# Patient Record
Sex: Female | Born: 1935 | Hispanic: No
Health system: Southern US, Community
[De-identification: ages and names within clinical notes are randomized; demographics above are authoritative.]

## PROBLEM LIST (undated history)

## (undated) ENCOUNTER — Emergency Department (HOSPITAL_COMMUNITY): Admission: EM | Payer: Self-pay | Source: Home / Self Care

## (undated) DIAGNOSIS — I10 Essential (primary) hypertension: Secondary | ICD-10-CM

## (undated) HISTORY — PX: OTHER SURGICAL HISTORY: SHX169

## (undated) HISTORY — PX: NO PAST SURGERIES: SHX2092

---

## 2007-11-19 ENCOUNTER — Emergency Department (HOSPITAL_BASED_OUTPATIENT_CLINIC_OR_DEPARTMENT_OTHER): Admission: EM | Admit: 2007-11-19 | Discharge: 2007-11-19 | Payer: Self-pay | Admitting: Emergency Medicine

## 2010-10-05 LAB — POCT CARDIAC MARKERS
CKMB, poc: <1 — ABNORMAL LOW
CKMB, poc: <1 — ABNORMAL LOW
Myoglobin, poc: 157
Myoglobin, poc: 74
Troponin i, poc: <0.05
Troponin i, poc: <0.05

## 2010-10-05 LAB — CBC
HCT: 39.1
Hemoglobin: 13.2
MCHC: 33.7
MCV: 87
Platelets: 207
RBC: 4.49
RDW: 13
WBC: 9.6

## 2010-10-05 LAB — COMPREHENSIVE METABOLIC PANEL WITH GFR
ALT: <6
Alkaline Phosphatase: 74
CO2: 28
GFR calc non Af Amer: >60
Glucose, Bld: 145 — ABNORMAL HIGH
Potassium: 3.5
Sodium: 133 — ABNORMAL LOW
Total Bilirubin: 1.3 — ABNORMAL HIGH

## 2010-10-05 LAB — COMPREHENSIVE METABOLIC PANEL
AST: 26
Albumin: 3.9
BUN: 7
Calcium: 9.1
Chloride: 97
Creatinine, Ser: 0.7
GFR calc Af Amer: >60
Total Protein: 7.4

## 2010-10-05 LAB — DIFFERENTIAL
Basophils Absolute: 0
Basophils Relative: 0
Eosinophils Absolute: 0
Eosinophils Relative: 0
Lymphocytes Relative: 8 — ABNORMAL LOW
Lymphs Abs: 0.7
Monocytes Absolute: 0.8
Monocytes Relative: 8
Neutro Abs: 8.1 — ABNORMAL HIGH
Neutrophils Relative %: 83 — ABNORMAL HIGH

## 2010-10-05 LAB — LIPASE, BLOOD: Lipase: 96

## 2012-08-13 ENCOUNTER — Ambulatory Visit: Payer: Self-pay

## 2012-08-20 ENCOUNTER — Ambulatory Visit: Payer: Self-pay

## 2015-08-06 DIAGNOSIS — R630 Anorexia: Secondary | ICD-10-CM | POA: Diagnosis not present

## 2015-08-06 DIAGNOSIS — I10 Essential (primary) hypertension: Secondary | ICD-10-CM | POA: Diagnosis not present

## 2015-08-06 DIAGNOSIS — H9041 Sensorineural hearing loss, unilateral, right ear, with unrestricted hearing on the contralateral side: Secondary | ICD-10-CM | POA: Diagnosis not present

## 2015-08-30 DIAGNOSIS — R5383 Other fatigue: Secondary | ICD-10-CM | POA: Diagnosis not present

## 2015-08-30 DIAGNOSIS — I10 Essential (primary) hypertension: Secondary | ICD-10-CM | POA: Diagnosis not present

## 2015-11-22 DIAGNOSIS — Z23 Encounter for immunization: Secondary | ICD-10-CM | POA: Diagnosis not present

## 2015-12-03 DIAGNOSIS — M79673 Pain in unspecified foot: Secondary | ICD-10-CM | POA: Diagnosis not present

## 2015-12-03 DIAGNOSIS — E2839 Other primary ovarian failure: Secondary | ICD-10-CM | POA: Diagnosis not present

## 2015-12-03 DIAGNOSIS — Z1231 Encounter for screening mammogram for malignant neoplasm of breast: Secondary | ICD-10-CM | POA: Diagnosis not present

## 2015-12-03 DIAGNOSIS — K7689 Other specified diseases of liver: Secondary | ICD-10-CM | POA: Diagnosis not present

## 2015-12-03 DIAGNOSIS — R630 Anorexia: Secondary | ICD-10-CM | POA: Diagnosis not present

## 2015-12-03 DIAGNOSIS — Z23 Encounter for immunization: Secondary | ICD-10-CM | POA: Diagnosis not present

## 2015-12-03 DIAGNOSIS — Z0000 Encounter for general adult medical examination without abnormal findings: Secondary | ICD-10-CM | POA: Diagnosis not present

## 2015-12-03 DIAGNOSIS — H903 Sensorineural hearing loss, bilateral: Secondary | ICD-10-CM | POA: Diagnosis not present

## 2015-12-03 DIAGNOSIS — I10 Essential (primary) hypertension: Secondary | ICD-10-CM | POA: Diagnosis not present

## 2015-12-03 DIAGNOSIS — E785 Hyperlipidemia, unspecified: Secondary | ICD-10-CM | POA: Diagnosis not present

## 2015-12-03 DIAGNOSIS — Z1211 Encounter for screening for malignant neoplasm of colon: Secondary | ICD-10-CM | POA: Diagnosis not present

## 2015-12-13 DIAGNOSIS — H903 Sensorineural hearing loss, bilateral: Secondary | ICD-10-CM | POA: Diagnosis not present

## 2015-12-14 DIAGNOSIS — M79672 Pain in left foot: Secondary | ICD-10-CM | POA: Diagnosis not present

## 2015-12-14 DIAGNOSIS — G609 Hereditary and idiopathic neuropathy, unspecified: Secondary | ICD-10-CM | POA: Diagnosis not present

## 2015-12-14 DIAGNOSIS — M79671 Pain in right foot: Secondary | ICD-10-CM | POA: Diagnosis not present

## 2016-03-03 DIAGNOSIS — E2839 Other primary ovarian failure: Secondary | ICD-10-CM | POA: Diagnosis not present

## 2016-03-03 DIAGNOSIS — Z6821 Body mass index (BMI) 21.0-21.9, adult: Secondary | ICD-10-CM | POA: Diagnosis not present

## 2016-03-03 DIAGNOSIS — I10 Essential (primary) hypertension: Secondary | ICD-10-CM | POA: Diagnosis not present

## 2016-03-03 DIAGNOSIS — H538 Other visual disturbances: Secondary | ICD-10-CM | POA: Diagnosis not present

## 2016-03-22 ENCOUNTER — Emergency Department (HOSPITAL_COMMUNITY): Payer: Medicare Other

## 2016-03-22 ENCOUNTER — Encounter (HOSPITAL_COMMUNITY): Payer: Self-pay | Admitting: Radiology

## 2016-03-22 ENCOUNTER — Emergency Department (HOSPITAL_COMMUNITY)
Admission: EM | Admit: 2016-03-22 | Discharge: 2016-03-22 | Disposition: A | Discharge status: Home / Self Care | Payer: Medicare Other | Attending: Emergency Medicine | Admitting: Emergency Medicine

## 2016-03-22 DIAGNOSIS — M79673 Pain in unspecified foot: Secondary | ICD-10-CM

## 2016-03-22 DIAGNOSIS — R0609 Other forms of dyspnea: Secondary | ICD-10-CM | POA: Diagnosis not present

## 2016-03-22 DIAGNOSIS — M79671 Pain in right foot: Secondary | ICD-10-CM | POA: Insufficient documentation

## 2016-03-22 DIAGNOSIS — R230 Cyanosis: Secondary | ICD-10-CM | POA: Diagnosis not present

## 2016-03-22 DIAGNOSIS — M79672 Pain in left foot: Secondary | ICD-10-CM | POA: Diagnosis not present

## 2016-03-22 DIAGNOSIS — I739 Peripheral vascular disease, unspecified: Secondary | ICD-10-CM | POA: Diagnosis not present

## 2016-03-22 DIAGNOSIS — I10 Essential (primary) hypertension: Secondary | ICD-10-CM | POA: Insufficient documentation

## 2016-03-22 DIAGNOSIS — M79674 Pain in right toe(s): Secondary | ICD-10-CM | POA: Diagnosis not present

## 2016-03-22 DIAGNOSIS — M79675 Pain in left toe(s): Secondary | ICD-10-CM | POA: Diagnosis not present

## 2016-03-22 HISTORY — DX: Essential (primary) hypertension: I10

## 2016-03-22 LAB — BASIC METABOLIC PANEL
Anion gap: 8 (ref 5–15)
BUN: 13 mg/dL (ref 6–20)
CALCIUM: 9.3 mg/dL (ref 8.9–10.3)
CO2: 27 mmol/L (ref 22–32)
CREATININE: 0.9 mg/dL (ref 0.44–1.00)
Chloride: 100 mmol/L — ABNORMAL LOW (ref 101–111)
GFR calc non Af Amer: 59 mL/min — ABNORMAL LOW (ref >60)
Glucose, Bld: 91 mg/dL (ref 65–99)
Potassium: 4.4 mmol/L (ref 3.5–5.1)
SODIUM: 135 mmol/L (ref 135–145)

## 2016-03-22 LAB — CBC
HCT: 41.3 % (ref 36.0–46.0)
Hemoglobin: 13.5 g/dL (ref 12.0–15.0)
MCH: 28.1 pg (ref 26.0–34.0)
MCHC: 32.7 g/dL (ref 30.0–36.0)
MCV: 85.9 fL (ref 78.0–100.0)
Platelets: 209 10*3/uL (ref 150–400)
RBC: 4.81 MIL/uL (ref 3.87–5.11)
RDW: 13.1 % (ref 11.5–15.5)
WBC: 7.3 10*3/uL (ref 4.0–10.5)

## 2016-03-22 MED ORDER — IOPAMIDOL (ISOVUE-370) INJECTION 76%
INTRAVENOUS | Status: AC
  Administered 2016-03-22: 100 mL
  Filled 2016-03-22: qty 100

## 2016-03-22 NOTE — Discharge Instructions (Signed)
Follow up with your doctor as instructed by Dr Darrick PennaFields

## 2016-03-22 NOTE — Consult Note (Addendum)
Patient name: Rebecca FishmanSudhaben Minor MRN: [id number] DOB: 05/17/1934 Sex: female  REASON FOR CONSULT: Blue toes  HPI: Rebecca FishmanSudhaben Barnette is a 82 y.o. female with about 10 day history of bilateral dusky toes initially which are now ruborous with red splotches on the dorsum of the feet.  She was sent by her primary care physician to the ER.  She has some chronic numbness and tingling in the feet that has been present for about 6 months.  She denies claudication.  She has some pain in the toes.  She denies family history of aneurysms.  She denies chest pain shortness of breath or abdominal pain.  She has no non-healing wounds. Other medical problems include hypertension for which she takes medication.  She has never smoked.  PMH and PSH as above  Family HX as above SOCIAL HISTORY: Social History   Social History  . Marital status: Widowed    Spouse name: N/A  . Number of children: N/A  . Years of education: N/A   Occupational History  . Not on file.   Social History Main Topics  . Smoking status: Not on file  . Smokeless tobacco: Not on file  . Alcohol use Not on file  . Drug use: Unknown  . Sexual activity: Not on file   Other Topics Concern  . Not on file   Social History Narrative  . No narrative on file    No Known Allergies  Meds: Pt takes metoprolol for hypertension  ROS:   General:  No weight loss, Fever, chills  HEENT: No recent headaches, no nasal bleeding, no visual changes, no sore throat  Neurologic: No dizziness, blackouts, seizures. No recent symptoms of stroke or mini- stroke. No recent episodes of slurred speech, or temporary blindness.  Cardiac: No recent episodes of chest pain/pressure, no shortness of breath at rest.  No shortness of breath with exertion.  Denies history of atrial fibrillation or irregular heartbeat  Vascular: No history of rest pain in feet.  No history of claudication.  No history of non-healing ulcer, No history of DVT   Pulmonary: No  home oxygen, no productive cough, no hemoptysis,  No asthma or wheezing  Musculoskeletal:  [ ]  Arthritis, [ ]  Low back pain,  [ ]  Joint pain  Hematologic:No history of hypercoagulable state.  No history of easy bleeding.  No history of anemia  Gastrointestinal: No hematochezia or melena,  No gastroesophageal reflux, no trouble swallowing  Urinary: [ ]  chronic Kidney disease, [ ]  on HD - [ ]  MWF or [ ]  TTHS, [ ]  Burning with urination, [ ]  Frequent urination, [ ]  Difficulty urinating;   Skin: No rashes other than feet as mentioned above  Psychological: No history of anxiety,  No history of depression   Physical Examination  Vitals:   03/22/16 1730 03/22/16 1731 03/22/16 1745 03/22/16 1800  BP: (!) 213/76 (!) 213/76 (!) 193/86 (!) 185/76  Pulse: (!) 55 (!) 55 (!) 55 (!) 56  Resp:  20    Temp:      SpO2: 100% 100% 100% 100%    There is no height or weight on file to calculate BMI.  General:  Alert and oriented, no acute distress HEENT: Normal Neck: No bruit or JVD Pulmonary: Clear to auscultation bilaterally Cardiac: Regular Rate and Rhythm  Abdomen: Soft, non-tender, non-distended, no mass, no scars Skin: No rash, splotchy red spots 2-3 cm diameter non blanching dorsal feet bilaterally about 5 of these each foot.  Toes  ruborous with brisk cap refill Extremity Pulses:  2+ radial, brachial, femoral, dorsalis pedis, posterior tibial pulses bilaterally Musculoskeletal: No deformity or edema  Neurologic: Upper and lower extremity motor 5/5 and symmetric   ASSESSMENT:  Pt with pain in toes, duskiness now resolved from a few days ago now with red splotches.  Has palpable pedal pulses so not currently limb threatening.  This could represent atheroembolic event otherwise some inflammatory or dermatologic process.  Numbness more likely neuropathy   PLAN:  Will obtain ct angio chest abdomen pelvis with as much runoff as possible to rule out central embolic source  Plan discussed with  ER physicians who will consider further work up or other etiology if no embolic source seen on CT   Fabienne Bruns, MD [facility] Office: [phone] Pager: [phone]  CT shows no source of embolus entire aorto iliac and proximal vessels all patent.  No vascular etiology for her pain or rash.  CT also shows possible mass in right lobe of liver and recommended MRI.  I discussed with pt and daughters and told them this could be followed with Dr Chales Salmon with [facility] to d/c home from my standpoint  Fabienne Bruns, MD [facility] Office: [phone] Pager: [phone]

## 2016-03-22 NOTE — ED Notes (Signed)
Pt has hx of HTN and states that she took her medication this morning at 10.

## 2016-03-22 NOTE — ED Triage Notes (Signed)
Pt here from MD office. PT has had 10 days of bilateral foot discoloration with coldness to touch and decreased sensation. Per family- MD Fields to come see the patient. Discoloration noted to bilateral feet.

## 2016-03-22 NOTE — ED Notes (Signed)
Pt returned from CT °

## 2016-03-22 NOTE — ED Notes (Signed)
Patient transported to CT 

## 2016-03-27 DIAGNOSIS — I739 Peripheral vascular disease, unspecified: Secondary | ICD-10-CM | POA: Diagnosis not present

## 2016-03-27 DIAGNOSIS — G629 Polyneuropathy, unspecified: Secondary | ICD-10-CM | POA: Diagnosis not present

## 2016-03-27 DIAGNOSIS — K7689 Other specified diseases of liver: Secondary | ICD-10-CM | POA: Diagnosis not present

## 2016-03-27 DIAGNOSIS — E038 Other specified hypothyroidism: Secondary | ICD-10-CM | POA: Diagnosis not present

## 2016-03-27 DIAGNOSIS — I10 Essential (primary) hypertension: Secondary | ICD-10-CM | POA: Diagnosis not present

## 2016-03-27 DIAGNOSIS — R630 Anorexia: Secondary | ICD-10-CM | POA: Diagnosis not present

## 2016-03-27 DIAGNOSIS — I776 Arteritis, unspecified: Secondary | ICD-10-CM | POA: Diagnosis not present

## 2016-03-27 DIAGNOSIS — G603 Idiopathic progressive neuropathy: Secondary | ICD-10-CM | POA: Diagnosis not present

## 2016-05-22 DIAGNOSIS — R05 Cough: Secondary | ICD-10-CM | POA: Diagnosis not present

## 2016-05-22 DIAGNOSIS — J019 Acute sinusitis, unspecified: Secondary | ICD-10-CM | POA: Diagnosis not present

## 2016-05-22 DIAGNOSIS — I10 Essential (primary) hypertension: Secondary | ICD-10-CM | POA: Diagnosis not present

## 2016-05-22 DIAGNOSIS — J209 Acute bronchitis, unspecified: Secondary | ICD-10-CM | POA: Diagnosis not present

## 2016-06-06 DIAGNOSIS — I776 Arteritis, unspecified: Secondary | ICD-10-CM | POA: Diagnosis not present

## 2016-06-06 DIAGNOSIS — K2900 Acute gastritis without bleeding: Secondary | ICD-10-CM | POA: Diagnosis not present

## 2016-06-06 DIAGNOSIS — R630 Anorexia: Secondary | ICD-10-CM | POA: Diagnosis not present

## 2016-06-06 DIAGNOSIS — I10 Essential (primary) hypertension: Secondary | ICD-10-CM | POA: Diagnosis not present

## 2016-07-11 DIAGNOSIS — R630 Anorexia: Secondary | ICD-10-CM | POA: Diagnosis not present

## 2016-07-11 DIAGNOSIS — I10 Essential (primary) hypertension: Secondary | ICD-10-CM | POA: Diagnosis not present

## 2016-07-11 DIAGNOSIS — Z1211 Encounter for screening for malignant neoplasm of colon: Secondary | ICD-10-CM | POA: Diagnosis not present

## 2016-07-11 DIAGNOSIS — G609 Hereditary and idiopathic neuropathy, unspecified: Secondary | ICD-10-CM | POA: Diagnosis not present

## 2016-07-12 ENCOUNTER — Encounter: Payer: Self-pay | Admitting: Neurology

## 2016-07-21 DIAGNOSIS — Z1211 Encounter for screening for malignant neoplasm of colon: Secondary | ICD-10-CM | POA: Diagnosis not present

## 2016-09-08 ENCOUNTER — Ambulatory Visit (INDEPENDENT_AMBULATORY_CARE_PROVIDER_SITE_OTHER): Payer: Medicare Other | Admitting: Neurology

## 2016-09-08 ENCOUNTER — Encounter: Payer: Self-pay | Admitting: Neurology

## 2016-09-08 VITALS — BP 124/70 | HR 60 | Ht 61.0 in | Wt 100.5 lb

## 2016-09-08 DIAGNOSIS — R209 Unspecified disturbances of skin sensation: Secondary | ICD-10-CM | POA: Diagnosis not present

## 2016-09-08 DIAGNOSIS — I7300 Raynaud's syndrome without gangrene: Secondary | ICD-10-CM | POA: Diagnosis not present

## 2016-09-08 NOTE — Patient Instructions (Addendum)
It was lovely to see you today.  You have remarkably normal neurological exam and I do not see that your feet pain is coming from a nerve problem.  You may have a little less circulation in the legs and we can order an ultrasound of the legs, if your pain gets worse.  Continue your medications as you are taking and start to apply powder to your feet when wearing stocks.  Please come back and see me, if you have any other questions or concerns.

## 2016-09-08 NOTE — Progress Notes (Signed)
[facility] Clinic Note - Initial Visit   Date: 09/08/16  Rebecca Foster MRN: [id number] DOB: [birth year]   Dear Dr. Chales Salmon:  Thank you for your kind referral of Rebecca Foster for consultation of bilateral feet pain. Although her history is well known to you, please allow [place] to reiterate it for the purpose of our medical record. The patient was accompanied to the clinic by daughter who also provides collateral information.     History of Present Illness: Rebecca Foster is a delightful 82 y.o. [place] female with hypertension and GERD presenting for evaluation of bilateral feet pain.    Starting around the early summer of 2018, she began having cold sensation of the feet, which would keep her awake at  There was no tingling or numbness. One night, she developed severe pain of the feet and blue-colored toes, which prompted her to go to the ER.  She had CTA chest/abd/pelvis which showed patent vessels above the knees. Since starting amlodipine 2.5mg , she has noticed marked improvement of pain, but continues to have cold feet.  Soaking her feet in warm water helps.  She tried gabapentin, which was discontinued due to side effects of bleeding.  She denies leg weakness but can get tired when walking and needs to rest.  She does not have imbalance and walks unassisted.  She has been loosing weight lately and was taking megace.  Her vitamin B12 and TSH from March 2018 was within normal limits.   Out-side paper records, electronic medical record, and images have been reviewed where available and summarized as:  Labs 03/2016:  Vitamin B12 467, TSH 5.63  Lab Results  Component Value Date   CREATININE 0.90 03/22/2016   BUN 13 03/22/2016   NA 135 03/22/2016   K 4.4 03/22/2016   CL 100 (L) 03/22/2016   CO2 27 03/22/2016    Past Medical History:  Diagnosis Date  . Hypertension     History reviewed. No pertinent surgical history.   Medications:  Outpatient  Encounter Prescriptions as of 09/08/2016  Medication Sig  . amLODipine (NORVASC) 2.5 MG tablet   . megestrol (MEGACE) 20 MG tablet   . metoprolol succinate (TOPROL-XL) 100 MG 24 hr tablet Take 100 mg by mouth 2 (two) times daily. Take with or immediately following a meal.   No facility-administered encounter medications on file as of 09/08/2016.      Allergies:  Allergies  Allergen Reactions  . Amoxicillin   . Codeine Other (See Comments)    Higher dose - sensitive, burning sensation     Family History:  There is no family history of neuropathy.  Social History: Social History  Substance Use Topics  . Smoking status: Never Smoker  . Smokeless tobacco: Never Used  . Alcohol use No   Social History   Social History Narrative   Lives alone in a one story home.  Has 3 daughters.  Education: high school.    Review of Systems:  CONSTITUTIONAL: No fevers, chills, night sweats, +weight loss.   EYES: No visual changes or eye pain ENT: No hearing changes.  No history of nose bleeds.   RESPIRATORY: No cough, wheezing and shortness of breath.   CARDIOVASCULAR: Negative for chest pain, and palpitations.   GI: Negative for abdominal discomfort, blood in stools or black stools.  No recent change in bowel habits.   GU:  No history of incontinence.   MUSCLOSKELETAL: No history of joint pain or swelling.  No myalgias.   SKIN:  Negative for lesions, rash, and itching.  +cold feet HEMATOLOGY/ONCOLOGY: Negative for prolonged bleeding, bruising easily, and swollen nodes.  No history of cancer.   ENDOCRINE: Negative for cold or heat intolerance, polydipsia or goiter.   PSYCH: No depression or anxiety symptoms.   NEURO: As Above.   Vital Signs:  BP 124/70   Pulse 60   Ht  (1.549 m)   Wt 100 lb 8 oz (45.6 kg)   SpO2 95%   BMI 18.99 kg/m    General Medical Exam:   General:  Well appearing, thin, comfortable.   Eyes/ENT: see cranial nerve examination.   Neck: No masses appreciated.   Full range of motion without tenderness.  No carotid bruits. Respiratory:  Clear to auscultation, good air entry bilaterally.   Cardiac:  Regular rate and rhythm, no murmur.   Extremities:  No deformities, edema, or skin discoloration. Feet are cool to touch, but distal pulses are intact Skin:  No rashes or lesions.  Neurological Exam: MENTAL STATUS including orientation to time, place, person, recent and remote memory, attention span and concentration, language, and fund of knowledge is normal.  Speech is not dysarthric.  CRANIAL NERVES: II:  No visual field defects.   III-IV-VI: Pupils equal round and reactive to light.  Normal conjugate, extra-ocular eye movements in all directions of gaze.  No nystagmus.  No ptosis.   V:  Normal facial sensation.   VII:  Normal facial symmetry and movements.   VIII:  Normal hearing and vestibular function.   IX-X:  Normal palatal movement.   XI:  Normal shoulder shrug and head rotation.   XII:  Normal tongue strength and range of motion, no deviation or fasciculation.  MOTOR:  No atrophy, fasciculations or abnormal movements.  No pronator drift.  Tone is normal.    Right Upper Extremity:    Left Upper Extremity:    Deltoid  5/5   Deltoid  5/5   Biceps  5/5   Biceps  5/5   Triceps  5/5   Triceps  5/5   Wrist extensors  5/5   Wrist extensors  5/5   Wrist flexors  5/5   Wrist flexors  5/5   Finger extensors  5/5   Finger extensors  5/5   Finger flexors  5/5   Finger flexors  5/5   Dorsal interossei  5/5   Dorsal interossei  5/5   Abductor pollicis  5/5   Abductor pollicis  5/5   Tone (Ashworth scale)  0  Tone (Ashworth scale)  0   Right Lower Extremity:    Left Lower Extremity:    Hip flexors  5/5   Hip flexors  5/5   Hip extensors  5/5   Hip extensors  5/5   Knee flexors  5/5   Knee flexors  5/5   Knee extensors  5/5   Knee extensors  5/5   Dorsiflexors  5/5   Dorsiflexors  5/5   Plantarflexors  5/5   Plantarflexors  5/5   Toe extensors  5/5    Toe extensors  5/5   Toe flexors  5/5   Toe flexors  5/5   Tone (Ashworth scale)  0  Tone (Ashworth scale)  0   MSRs:  Right  Left brachioradialis 2+  brachioradialis 2+  biceps 2+  biceps 2+  triceps 2+  triceps 2+  patellar 2+  patellar 2+  ankle jerk 1+  ankle jerk 1+  Hoffman no  Hoffman no  plantar response down  plantar response down   SENSORY:  Normal and symmetric perception of light touch, pinprick, vibration, and proprioception.  Romberg's sign absent.   COORDINATION/GAIT: Normal finger-to- nose-finger.  Intact rapid alternating movements bilaterally. Gait narrow based and stable. Heel walk is limited due to ankle pain.  IMPRESSION: Rebecca Foster is a delightful 82 year-old female referred for bilateral feet pain and cold sensation.  Her pain have markedly improved with low dose amlodipine suggesting that symptoms may be due to Raynaud's phenomenon.  Her neurological exam is remarkably normal making neuropathy less likely.  Given low clinical suspicion for neuropathy and improved symptoms, I do not see that NCS/EMG is indicated at this time.  If her symptoms progress, I recommend arterial studies of the legs as the next step.  Her daughter will call the office with any new neurological concerns/questions.   I also encouraged her to take her megace as appetite stimulant as she is under weight (BMI 18.9).  She does not like the taste of ensure, so I urged her to liberally eat the foods that she likes.   The duration of this appointment visit was 40 minutes of face-to-face time with the patient.  Greater than 50% of this time was spent in counseling, explanation of diagnosis, planning of further management, and coordination of care.   Thank you for allowing me to participate in patient's care.  If I can answer any additional questions, I would be pleased to do so.    Sincerely,    Sommer Spickard K. Allena Katz, DO

## 2016-10-16 ENCOUNTER — Ambulatory Visit: Payer: Medicare Other | Admitting: Neurology

## 2016-10-31 DIAGNOSIS — E2839 Other primary ovarian failure: Secondary | ICD-10-CM | POA: Diagnosis not present

## 2016-10-31 DIAGNOSIS — I10 Essential (primary) hypertension: Secondary | ICD-10-CM | POA: Diagnosis not present

## 2016-10-31 DIAGNOSIS — Z23 Encounter for immunization: Secondary | ICD-10-CM | POA: Diagnosis not present

## 2016-10-31 DIAGNOSIS — I739 Peripheral vascular disease, unspecified: Secondary | ICD-10-CM | POA: Diagnosis not present

## 2016-12-06 DIAGNOSIS — Z7189 Other specified counseling: Secondary | ICD-10-CM | POA: Diagnosis not present

## 2016-12-06 DIAGNOSIS — Z1389 Encounter for screening for other disorder: Secondary | ICD-10-CM | POA: Diagnosis not present

## 2016-12-06 DIAGNOSIS — Z0000 Encounter for general adult medical examination without abnormal findings: Secondary | ICD-10-CM | POA: Diagnosis not present

## 2016-12-06 DIAGNOSIS — R946 Abnormal results of thyroid function studies: Secondary | ICD-10-CM | POA: Diagnosis not present

## 2016-12-06 DIAGNOSIS — I7300 Raynaud's syndrome without gangrene: Secondary | ICD-10-CM | POA: Diagnosis not present

## 2016-12-06 DIAGNOSIS — E039 Hypothyroidism, unspecified: Secondary | ICD-10-CM | POA: Diagnosis not present

## 2016-12-06 DIAGNOSIS — I10 Essential (primary) hypertension: Secondary | ICD-10-CM | POA: Diagnosis not present

## 2017-01-04 DIAGNOSIS — M810 Age-related osteoporosis without current pathological fracture: Secondary | ICD-10-CM | POA: Diagnosis not present

## 2017-01-04 DIAGNOSIS — E2839 Other primary ovarian failure: Secondary | ICD-10-CM | POA: Diagnosis not present

## 2017-03-23 DIAGNOSIS — B351 Tinea unguium: Secondary | ICD-10-CM | POA: Diagnosis not present

## 2017-03-23 DIAGNOSIS — E039 Hypothyroidism, unspecified: Secondary | ICD-10-CM | POA: Diagnosis not present

## 2017-03-23 DIAGNOSIS — I7300 Raynaud's syndrome without gangrene: Secondary | ICD-10-CM | POA: Diagnosis not present

## 2017-06-08 DIAGNOSIS — E039 Hypothyroidism, unspecified: Secondary | ICD-10-CM | POA: Diagnosis not present

## 2017-06-08 DIAGNOSIS — I7300 Raynaud's syndrome without gangrene: Secondary | ICD-10-CM | POA: Diagnosis not present

## 2017-06-08 DIAGNOSIS — I10 Essential (primary) hypertension: Secondary | ICD-10-CM | POA: Diagnosis not present

## 2017-06-08 DIAGNOSIS — B351 Tinea unguium: Secondary | ICD-10-CM | POA: Diagnosis not present

## 2017-10-03 DIAGNOSIS — E039 Hypothyroidism, unspecified: Secondary | ICD-10-CM | POA: Diagnosis not present

## 2017-10-03 DIAGNOSIS — J029 Acute pharyngitis, unspecified: Secondary | ICD-10-CM | POA: Diagnosis not present

## 2017-10-03 DIAGNOSIS — I10 Essential (primary) hypertension: Secondary | ICD-10-CM | POA: Diagnosis not present

## 2017-11-03 DIAGNOSIS — Z23 Encounter for immunization: Secondary | ICD-10-CM | POA: Diagnosis not present

## 2017-11-22 DIAGNOSIS — R21 Rash and other nonspecific skin eruption: Secondary | ICD-10-CM | POA: Diagnosis not present

## 2017-12-07 DIAGNOSIS — Z1389 Encounter for screening for other disorder: Secondary | ICD-10-CM | POA: Diagnosis not present

## 2017-12-07 DIAGNOSIS — Z23 Encounter for immunization: Secondary | ICD-10-CM | POA: Diagnosis not present

## 2017-12-07 DIAGNOSIS — Z0000 Encounter for general adult medical examination without abnormal findings: Secondary | ICD-10-CM | POA: Diagnosis not present

## 2017-12-07 DIAGNOSIS — I10 Essential (primary) hypertension: Secondary | ICD-10-CM | POA: Diagnosis not present

## 2017-12-07 DIAGNOSIS — I7300 Raynaud's syndrome without gangrene: Secondary | ICD-10-CM | POA: Diagnosis not present

## 2017-12-07 DIAGNOSIS — E039 Hypothyroidism, unspecified: Secondary | ICD-10-CM | POA: Diagnosis not present

## 2017-12-07 DIAGNOSIS — L209 Atopic dermatitis, unspecified: Secondary | ICD-10-CM | POA: Diagnosis not present

## 2018-05-30 DIAGNOSIS — R630 Anorexia: Secondary | ICD-10-CM | POA: Diagnosis not present

## 2018-05-30 DIAGNOSIS — I10 Essential (primary) hypertension: Secondary | ICD-10-CM | POA: Diagnosis not present

## 2018-05-30 DIAGNOSIS — E039 Hypothyroidism, unspecified: Secondary | ICD-10-CM | POA: Diagnosis not present

## 2018-07-31 DIAGNOSIS — I10 Essential (primary) hypertension: Secondary | ICD-10-CM | POA: Diagnosis not present

## 2018-07-31 DIAGNOSIS — G44209 Tension-type headache, unspecified, not intractable: Secondary | ICD-10-CM | POA: Diagnosis not present

## 2018-07-31 DIAGNOSIS — E039 Hypothyroidism, unspecified: Secondary | ICD-10-CM | POA: Diagnosis not present

## 2018-07-31 DIAGNOSIS — F418 Other specified anxiety disorders: Secondary | ICD-10-CM | POA: Diagnosis not present

## 2018-08-14 DIAGNOSIS — R509 Fever, unspecified: Secondary | ICD-10-CM | POA: Diagnosis not present

## 2018-08-14 DIAGNOSIS — R6889 Other general symptoms and signs: Secondary | ICD-10-CM | POA: Diagnosis not present

## 2018-10-14 ENCOUNTER — Ambulatory Visit
Admission: RE | Admit: 2018-10-14 | Discharge: 2018-10-14 | Disposition: A | Discharge status: Home / Self Care | Payer: Medicare Other | Source: Ambulatory Visit | Attending: Internal Medicine | Admitting: Internal Medicine

## 2018-10-14 ENCOUNTER — Other Ambulatory Visit: Payer: Self-pay | Admitting: Internal Medicine

## 2018-10-14 DIAGNOSIS — R079 Chest pain, unspecified: Secondary | ICD-10-CM

## 2018-10-14 DIAGNOSIS — R05 Cough: Secondary | ICD-10-CM | POA: Diagnosis not present

## 2018-10-14 DIAGNOSIS — I10 Essential (primary) hypertension: Secondary | ICD-10-CM | POA: Diagnosis not present

## 2018-10-14 DIAGNOSIS — E039 Hypothyroidism, unspecified: Secondary | ICD-10-CM | POA: Diagnosis not present

## 2018-10-14 DIAGNOSIS — J90 Pleural effusion, not elsewhere classified: Secondary | ICD-10-CM | POA: Diagnosis not present

## 2018-10-15 ENCOUNTER — Other Ambulatory Visit: Payer: Self-pay

## 2018-10-15 ENCOUNTER — Encounter: Payer: Self-pay | Admitting: Cardiology

## 2018-10-15 ENCOUNTER — Ambulatory Visit (INDEPENDENT_AMBULATORY_CARE_PROVIDER_SITE_OTHER): Payer: Medicare Other | Admitting: Cardiology

## 2018-10-15 ENCOUNTER — Telehealth: Payer: Self-pay | Admitting: Cardiology

## 2018-10-15 VITALS — BP 120/78 | HR 63 | Ht 59.0 in | Wt 105.8 lb

## 2018-10-15 DIAGNOSIS — R011 Cardiac murmur, unspecified: Secondary | ICD-10-CM

## 2018-10-15 DIAGNOSIS — I10 Essential (primary) hypertension: Secondary | ICD-10-CM | POA: Insufficient documentation

## 2018-10-15 DIAGNOSIS — R9431 Abnormal electrocardiogram [ECG] [EKG]: Secondary | ICD-10-CM

## 2018-10-15 MED ORDER — METOPROLOL SUCCINATE ER 50 MG PO TB24: 50.0000 mg | ORAL_TABLET | Freq: Two times a day (BID) | ORAL | 4 refills | Status: DC

## 2018-10-15 NOTE — Progress Notes (Signed)
Cardiology Office Note:    Date:  10/15/2018   ID:  Rebecca Foster, Rebecca Foster 11-16-1934, MRN [id number]  PCP:  Gynecology, [facility]  Cardiologist:  Garwin Brothers, MD   Referring MD: Gynecology, Jiles Prows*    ASSESSMENT:    1. Nonspecific abnormal electrocardiogram (ECG) (EKG)   2. Essential hypertension    PLAN:    In order of problems listed above:  1. Abnormal EKG: I discussed my findings with the patient at extensive length.  She is completely asymptomatic from a cardiovascular standpoint.  She leads a sedentary lifestyle but with activities of daily living she has no chest pain.  In view of this we will continue medical management at this time. 2. Essential hypertension: Her blood pressure is borderline.  She complains of fatigue I want to reduce her beta-blocker down to half dose.  She might be getting a little more of this medication and she is agreeable for this.  Echocardiogram will be done to assess murmur heard on auscultation patient will be seen in follow-up appointment in a month or earlier if he has any concerns.  I called her primary care physician and discussed these treatment plans and strategies and we concur.  She knows to go to the nearest emergency room for any concerning symptoms.  Patient and daughter had multiple questions which were answered to their satisfaction.   Medication Adjustments/Labs and Tests Ordered: Current medicines are reviewed at length with the patient today.  Concerns regarding medicines are outlined above.  No orders of the defined types were placed in this encounter.  Meds ordered this encounter  Medications  . metoprolol succinate (TOPROL-XL) 50 MG 24 hr tablet    Sig: Take 1 tablet (50 mg total) by mouth 2 (two) times daily. Take with or immediately following a meal.    Dispense:  30 tablet    Refill:  4     History of Present Illness:    Rebecca Foster is a 84 y.o. female who is being seen today for the evaluation of  abnormal EKG at the request of Gynecology, [facility]*.  Patient is a pleasant 84 year old female.  She has past medical history of essential hypertension.  She was referred because of abnormal EKG and T wave inversions.  I would like to receive those records.  She denies any history of dyslipidemia or diabetes mellitus.  No history of chest pain orthopnea or PND.  She leads a sedentary lifestyle.  The daughter accompanies her for this visit and is very supportive.  She mentions to me that her mother complains of some fatigue in general and does not have a great appetite.  At the time of my evaluation, the patient is alert awake oriented and in no distress.  Past Medical History:  Diagnosis Date  . Hypertension     Past Surgical History:  Procedure Laterality Date  . NO PAST SURGERIES      Current Medications: Current Meds  Medication Sig  . amLODipine (NORVASC) 2.5 MG tablet Take 2.5 mg by mouth daily.   Marland Kitchen levothyroxine (SYNTHROID) 25 MCG tablet Take 1 tablet by mouth daily.  . metoprolol succinate (TOPROL-XL) 50 MG 24 hr tablet Take 1 tablet (50 mg total) by mouth 2 (two) times daily. Take with or immediately following a meal.  . [DISCONTINUED] metoprolol succinate (TOPROL-XL) 100 MG 24 hr tablet Take 100 mg by mouth 2 (two) times daily. Take with or immediately following a meal.     Allergies:  Amoxicillin and Codeine   Social History   Socioeconomic History  . Marital status: Widowed    Spouse name: Not on file  . Number of children: 3  . Years of education: 33  . Highest education level: Not on file  Occupational History  . Not on file  Social Needs  . Financial resource strain: Not on file  . Food insecurity    Worry: Not on file    Inability: Not on file  . Transportation needs    Medical: Not on file    Non-medical: Not on file  Tobacco Use  . Smoking status: Never Smoker  . Smokeless tobacco: Never Used  Substance and Sexual Activity  . Alcohol use: No  . Drug  use: No  . Sexual activity: Not on file  Lifestyle  . Physical activity    Days per week: Not on file    Minutes per session: Not on file  . Stress: Not on file  Relationships  . Social Herbalist on phone: Not on file    Gets together: Not on file    Attends religious service: Not on file    Active member of club or organization: Not on file    Attends meetings of clubs or organizations: Not on file    Relationship status: Not on file  Other Topics Concern  . Not on file  Social History Narrative   Lives alone in a one story home.  Has 3 daughters.  Education: high school.     Family History: The patient's family history is negative for Heart attack and Stroke.  ROS:   Please see the history of present illness.    All other systems reviewed and are negative.  EKGs/Labs/Other Studies Reviewed:    The following studies were reviewed today: Awaiting records from primary care physician's office.  Discussed with primary care physician today also at length.   Recent Labs: No results found for requested labs within last 8760 hours.  Recent Lipid Panel No results found for: CHOL, TRIG, HDL, CHOLHDL, VLDL, LDLCALC, LDLDIRECT  Physical Exam:    VS:  BP 120/78 (BP Location: Right Arm, Patient Position: Sitting, Cuff Size: Normal)   Pulse 63   Ht 4\' 11"  (1.499 m)   Wt 105 lb 12.8 oz (48 kg)   SpO2 96%   BMI 21.37 kg/m     Wt Readings from Last 3 Encounters:  10/15/18 105 lb 12.8 oz (48 kg)  09/08/16 100 lb 8 oz (45.6 kg)     GEN: Patient is in no acute distress HEENT: Normal NECK: No JVD; No carotid bruits LYMPHATICS: No lymphadenopathy CARDIAC: S1 S2 regular, 2/6 systolic murmur at the apex. RESPIRATORY:  Clear to auscultation without rales, wheezing or rhonchi  ABDOMEN: Soft, non-tender, non-distended MUSCULOSKELETAL:  No edema; No deformity  SKIN: Warm and dry NEUROLOGIC:  Alert and oriented x 3 PSYCHIATRIC:  Normal affect    Signed, Jenean Lindau, MD  10/15/2018 12:26 PM    [place]

## 2018-10-15 NOTE — Patient Instructions (Signed)
Medication Instructions:  Your physician has recommended you make the following change in your medication:   DECREASE metoprolol to 50 mg(1 tablet) once daily If you need a refill on your cardiac medications before your next appointment, please call your pharmacy.   Lab work: NONE If you have labs (blood work) drawn today and your tests are completely normal, you will receive your results only by: Marland Kitchen MyChart Message (if you have MyChart) OR . A paper copy in the mail If you have any lab test that is abnormal or we need to change your treatment, we will call you to review the results.  Testing/Procedures: NONE  Follow-Up: At [facility], you and your health needs are our priority.  As part of our continuing mission to provide you with exceptional heart care, we have created designated Provider Care Teams.  These Care Teams include your primary Cardiologist (physician) and Advanced Practice Providers (APPs -  Physician Assistants and Nurse Practitioners) who all work together to provide you with the care you need, when you need it. You will need a follow up appointment in 1 months.

## 2018-10-15 NOTE — Telephone Encounter (Signed)
States she was supposed to have an echo scheduled but there is no order

## 2018-10-16 ENCOUNTER — Telehealth: Payer: Self-pay | Admitting: Cardiology

## 2018-10-16 NOTE — Telephone Encounter (Signed)
Has questions about asprin

## 2018-10-16 NOTE — Addendum Note (Signed)
Addended by: Beckey Rutter on: 10/16/2018 08:26 AM   Modules accepted: Orders

## 2018-10-17 NOTE — Telephone Encounter (Signed)
Patient called wanting to know if she should be taking an 81 mg aspirin also. It was not discussed in appt but she would like to know if it would be beneficial to her to start taking it?Request routed to Dr. Docia Furl for advisement.

## 2018-10-17 NOTE — Telephone Encounter (Signed)
Not necessary at this time.

## 2018-10-18 DIAGNOSIS — J189 Pneumonia, unspecified organism: Secondary | ICD-10-CM | POA: Diagnosis not present

## 2018-10-19 ENCOUNTER — Other Ambulatory Visit: Payer: Self-pay

## 2018-10-19 ENCOUNTER — Emergency Department (HOSPITAL_COMMUNITY): Payer: Medicaid Other

## 2018-10-19 ENCOUNTER — Encounter (HOSPITAL_COMMUNITY): Payer: Self-pay | Admitting: Radiology

## 2018-10-19 ENCOUNTER — Inpatient Hospital Stay (HOSPITAL_COMMUNITY)
Admission: EM | Admit: 2018-10-19 | Discharge: 2018-10-22 | DRG: 292 | Disposition: A | Discharge status: Home / Self Care | Payer: Medicaid Other | Attending: Internal Medicine | Admitting: Internal Medicine

## 2018-10-19 DIAGNOSIS — Z20828 Contact with and (suspected) exposure to other viral communicable diseases: Secondary | ICD-10-CM | POA: Diagnosis present

## 2018-10-19 DIAGNOSIS — Z79890 Hormone replacement therapy (postmenopausal): Secondary | ICD-10-CM

## 2018-10-19 DIAGNOSIS — J90 Pleural effusion, not elsewhere classified: Secondary | ICD-10-CM | POA: Diagnosis not present

## 2018-10-19 DIAGNOSIS — Z23 Encounter for immunization: Secondary | ICD-10-CM

## 2018-10-19 DIAGNOSIS — R110 Nausea: Secondary | ICD-10-CM | POA: Diagnosis not present

## 2018-10-19 DIAGNOSIS — I10 Essential (primary) hypertension: Secondary | ICD-10-CM | POA: Diagnosis present

## 2018-10-19 DIAGNOSIS — Z885 Allergy status to narcotic agent status: Secondary | ICD-10-CM

## 2018-10-19 DIAGNOSIS — R05 Cough: Secondary | ICD-10-CM | POA: Diagnosis not present

## 2018-10-19 DIAGNOSIS — R0600 Dyspnea, unspecified: Secondary | ICD-10-CM

## 2018-10-19 DIAGNOSIS — J189 Pneumonia, unspecified organism: Secondary | ICD-10-CM | POA: Diagnosis present

## 2018-10-19 DIAGNOSIS — Z7982 Long term (current) use of aspirin: Secondary | ICD-10-CM

## 2018-10-19 DIAGNOSIS — E039 Hypothyroidism, unspecified: Secondary | ICD-10-CM | POA: Diagnosis present

## 2018-10-19 DIAGNOSIS — R0602 Shortness of breath: Secondary | ICD-10-CM

## 2018-10-19 DIAGNOSIS — I110 Hypertensive heart disease with heart failure: Principal | ICD-10-CM | POA: Diagnosis present

## 2018-10-19 DIAGNOSIS — Z880 Allergy status to penicillin: Secondary | ICD-10-CM

## 2018-10-19 DIAGNOSIS — Z79899 Other long term (current) drug therapy: Secondary | ICD-10-CM

## 2018-10-19 DIAGNOSIS — I5033 Acute on chronic diastolic (congestive) heart failure: Secondary | ICD-10-CM | POA: Diagnosis present

## 2018-10-19 DIAGNOSIS — I313 Pericardial effusion (noninflammatory): Secondary | ICD-10-CM | POA: Diagnosis present

## 2018-10-19 DIAGNOSIS — J918 Pleural effusion in other conditions classified elsewhere: Secondary | ICD-10-CM | POA: Diagnosis present

## 2018-10-19 HISTORY — PX: US PLEURAL EFFUSION BILATERAL (ARMC HX): HXRAD1413

## 2018-10-19 LAB — CBC WITH DIFFERENTIAL/PLATELET
Abs Immature Granulocytes: 0.03 10*3/uL (ref 0.00–0.07)
Basophils Absolute: 0 10*3/uL (ref 0.0–0.1)
Basophils Relative: 0 %
Eosinophils Absolute: 0 10*3/uL (ref 0.0–0.5)
Eosinophils Relative: 0 %
HCT: 42.5 % (ref 36.0–46.0)
Hemoglobin: 14.2 g/dL (ref 12.0–15.0)
Immature Granulocytes: 0 %
Lymphocytes Relative: 10 %
Lymphs Abs: 1 10*3/uL (ref 0.7–4.0)
MCH: 28.2 pg (ref 26.0–34.0)
MCHC: 33.4 g/dL (ref 30.0–36.0)
MCV: 84.5 fL (ref 80.0–100.0)
Monocytes Absolute: 0.6 10*3/uL (ref 0.1–1.0)
Monocytes Relative: 6 %
Neutro Abs: 8.4 10*3/uL — ABNORMAL HIGH (ref 1.7–7.7)
Neutrophils Relative %: 84 %
Platelets: 398 10*3/uL (ref 150–400)
RBC: 5.03 MIL/uL (ref 3.87–5.11)
RDW: 12.8 % (ref 11.5–15.5)
WBC: 10.1 10*3/uL (ref 4.0–10.5)
nRBC: 0 % (ref 0.0–0.2)

## 2018-10-19 LAB — BASIC METABOLIC PANEL WITH GFR
Anion gap: 14 (ref 5–15)
BUN: 6 mg/dL — ABNORMAL LOW (ref 8–23)
CO2: 21 mmol/L — ABNORMAL LOW (ref 22–32)
Calcium: 9 mg/dL (ref 8.9–10.3)
Chloride: 93 mmol/L — ABNORMAL LOW (ref 98–111)
Creatinine, Ser: 0.79 mg/dL (ref 0.44–1.00)
GFR calc Af Amer: >60 mL/min
GFR calc non Af Amer: >60 mL/min
Glucose, Bld: 99 mg/dL (ref 70–99)
Potassium: 3.9 mmol/L (ref 3.5–5.1)
Sodium: 128 mmol/L — ABNORMAL LOW (ref 135–145)

## 2018-10-19 LAB — TROPONIN I (HIGH SENSITIVITY)
Troponin I (High Sensitivity): 9 ng/L
Troponin I (High Sensitivity): 9 ng/L
Troponin I (High Sensitivity): 15 ng/L

## 2018-10-19 LAB — SARS CORONAVIRUS 2 (TAT 6-24 HRS): SARS Coronavirus 2: NEGATIVE

## 2018-10-19 LAB — D-DIMER, QUANTITATIVE: D-Dimer, Quant: 3.97 ug{FEU}/mL — ABNORMAL HIGH (ref 0.00–0.50)

## 2018-10-19 LAB — HEPATIC FUNCTION PANEL
ALT: 18 U/L (ref 0–44)
AST: 25 U/L (ref 15–41)
Albumin: 2.4 g/dL — ABNORMAL LOW (ref 3.5–5.0)
Alkaline Phosphatase: 67 U/L (ref 38–126)
Bilirubin, Direct: 0.2 mg/dL (ref 0.0–0.2)
Indirect Bilirubin: 0.8 mg/dL (ref 0.3–0.9)
Total Bilirubin: 1 mg/dL (ref 0.3–1.2)
Total Protein: 6.6 g/dL (ref 6.5–8.1)

## 2018-10-19 LAB — BRAIN NATRIURETIC PEPTIDE: B Natriuretic Peptide: 85.4 pg/mL (ref 0.0–100.0)

## 2018-10-19 MED ORDER — LEVOTHYROXINE SODIUM 25 MCG PO TABS
25.0000 ug | ORAL_TABLET | Freq: Every day | ORAL | Status: DC
  Administered 2018-10-20 – 2018-10-22 (×3): 25 ug via ORAL
  Filled 2018-10-19 (×3): qty 1

## 2018-10-19 MED ORDER — ONDANSETRON HCL 4 MG/2ML IJ SOLN: 4.0000 mg | Freq: Four times a day (QID) | INTRAMUSCULAR | Status: DC | PRN

## 2018-10-19 MED ORDER — SODIUM CHLORIDE 0.9 % IV SOLN
500.0000 mg | Freq: Once | INTRAVENOUS | Status: AC
  Administered 2018-10-19: 22:00:00 500 mg via INTRAVENOUS
  Filled 2018-10-19: qty 500

## 2018-10-19 MED ORDER — BENZONATATE 100 MG PO CAPS
200.0000 mg | ORAL_CAPSULE | Freq: Three times a day (TID) | ORAL | Status: DC | PRN
  Administered 2018-10-20: 200 mg via ORAL
  Filled 2018-10-19: qty 2

## 2018-10-19 MED ORDER — METOPROLOL SUCCINATE ER 50 MG PO TB24
50.0000 mg | ORAL_TABLET | Freq: Two times a day (BID) | ORAL | Status: DC
  Administered 2018-10-19 – 2018-10-22 (×6): 50 mg via ORAL
  Filled 2018-10-19 (×2): qty 1
  Filled 2018-10-19: qty 2
  Filled 2018-10-19 (×3): qty 1

## 2018-10-19 MED ORDER — SODIUM CHLORIDE 0.9 % IV SOLN
1.0000 g | Freq: Once | INTRAVENOUS | Status: AC
  Administered 2018-10-19: 1 g via INTRAVENOUS
  Filled 2018-10-19: qty 10

## 2018-10-19 MED ORDER — SODIUM CHLORIDE 0.9 % IV SOLN
500.0000 mg | INTRAVENOUS | Status: DC
  Filled 2018-10-19: qty 500

## 2018-10-19 MED ORDER — SODIUM CHLORIDE 0.9 % IV SOLN: 1.0000 g | INTRAVENOUS | Status: DC

## 2018-10-19 MED ORDER — ACETAMINOPHEN 325 MG PO TABS
650.0000 mg | ORAL_TABLET | Freq: Four times a day (QID) | ORAL | Status: DC | PRN
  Filled 2018-10-19: qty 2

## 2018-10-19 MED ORDER — MELATONIN 3 MG PO TABS
1.5000 mg | ORAL_TABLET | Freq: Every evening | ORAL | Status: DC | PRN
  Administered 2018-10-20 – 2018-10-21 (×3): 1.5 mg via ORAL
  Filled 2018-10-19 (×4): qty 0.5

## 2018-10-19 MED ORDER — ACETAMINOPHEN 650 MG RE SUPP: 650.0000 mg | Freq: Four times a day (QID) | RECTAL | Status: DC | PRN

## 2018-10-19 MED ORDER — ONDANSETRON HCL 4 MG PO TABS: 4.0000 mg | ORAL_TABLET | Freq: Four times a day (QID) | ORAL | Status: DC | PRN

## 2018-10-19 MED ORDER — IOHEXOL 350 MG/ML SOLN
75.0000 mL | Freq: Once | INTRAVENOUS | Status: AC | PRN
  Administered 2018-10-19: 20:00:00 75 mL via INTRAVENOUS

## 2018-10-19 MED ORDER — AMLODIPINE BESYLATE 2.5 MG PO TABS
2.5000 mg | ORAL_TABLET | Freq: Every morning | ORAL | Status: DC
  Administered 2018-10-20: 2.5 mg via ORAL
  Filled 2018-10-19: qty 1

## 2018-10-19 NOTE — ED Notes (Signed)
Taken to CT.

## 2018-10-19 NOTE — H&P (Signed)
History and Physical    Rebecca Foster Bernard ZOX:[id number] DOB: 05/18/1934 DOA: 10/19/2018  [facility]  Patient coming from: Home  I have personally briefly reviewed [facility]  Chief Complaint: Cough, SOB  HPI: Rebecca Foster Bedonie is a 84 y.o. female with medical history significant of HTN.  Patient has had about 1 week h/o dyspnea, occasional dry cough.  No fever until last night she had subjective fever.  Some chest discomfort.  No sick contacts.  Saw PCP earlier in week, EKG showed T wave flatening in anterior leads and TWI in lateral leads, this new from 2018 apparently.  Saw Dr. Consuello Bossierevanker x4 days ago.  Trop nl.  2d echo ordered for near future.  Plan was to reduce beta-blocker by half, obtain ECHO and follow-up in a month.  Started on levaquin yesterday.   ED Course: WBC 10.1k, no SIRS.  BP 140s-160s systolic.  Satting 95% on RA.  CTA chest: 1) no PE 2) large R and small-moderate L pleural effusions 3) Adjacent actelectasis vs PNA 4) Cardiomegally and small pericardial effusion.  Trop neg x2 in ED.  Given rocephin / azithro in ED.   Review of Systems: As per HPI, otherwise all review of systems negative.  Past Medical History:  Diagnosis Date   Hypertension     Past Surgical History:  Procedure Laterality Date   NO PAST SURGERIES       reports that she has never smoked. She has never used smokeless tobacco. She reports that she does not drink alcohol or use drugs.  Allergies  Allergen Reactions   Amoxicillin Other (See Comments)    Extreme fatigue Did it involve swelling of the face/tongue/throat, SOB, or low BP? No Did it involve sudden or severe rash/hives, skin peeling, or any reaction on the inside of your mouth or nose? No Did you need to seek medical attention at a hospital or doctor's office? No When did it last happen?[age] ish years old If all above answers are NO, may proceed with  cephalosporin use.   Codeine Other (See Comments)    Higher dose - sensitive, burning sensation     Family History  Problem Relation Age of Onset   Heart attack Neg Hx    Stroke Neg Hx      Prior to Admission medications   Medication Sig Start Date End Date Taking? Authorizing Provider  amLODipine (NORVASC) 2.5 MG tablet Take 2.5 mg by mouth every morning.  08/30/16  Yes [provider]  aspirin EC 81 MG tablet Take 81 mg by mouth daily.   Yes [provider]  levofloxacin (LEVAQUIN) 250 MG tablet Take 250 mg by mouth See admin instructions. Take one tablet (250 mg) by mouth twice on 1st day - after lunch and after supper, then take one tablet (250 mg) daily after lunch for 9 days 10/18/18  Yes [provider]  levothyroxine (SYNTHROID) 25 MCG tablet Take 25 mcg by mouth daily before breakfast.  08/24/18  Yes [provider]  Melatonin 3 MG TABS Take 1.5 mg by mouth at bedtime as needed (sleep).   Yes [provider]  metoprolol succinate (TOPROL-XL) 50 MG 24 hr tablet Take 1 tablet (50 mg total) by mouth 2 (two) times daily. Take with or immediately following a meal. Patient taking differently: Take 50 mg by mouth See admin instructions. Take one tablet (50 mg) by mouth twice daily - after lunch and supper 10/15/18  Yes  Revankar, Reita Cliche, MD  Pseudoeph-Doxylamine-DM-APAP (NYQUIL PO) Take 20 mLs by mouth at bedtime as needed (cough and cold symptoms).   Yes [provider]    Physical Exam: Vitals:   10/19/18 2045 10/19/18 2100 10/19/18 2115 10/19/18 2130  BP: 140/74 (!) 157/83 (!) 165/84 (!) 160/77  Pulse: 84 87 87 90  Resp: 16 (!) 22 (!) 23 18  Temp:      TempSrc:      SpO2: 95% 95% 95% 96%  Weight:      Height:        Constitutional: NAD, calm, comfortable Eyes: PERRL, lids and conjunctivae normal ENMT: Mucous membranes are moist. Posterior pharynx clear of any exudate or lesions.Normal dentition.  Neck: normal, supple,  no masses, no thyromegaly Respiratory: clear to auscultation bilaterally, no wheezing, no crackles. Normal respiratory effort. No accessory muscle use.  Cardiovascular: Regular rate and rhythm, no murmurs / rubs / gallops. No extremity edema. 2+ pedal pulses. No carotid bruits.  Abdomen: no tenderness, no masses palpated. No hepatosplenomegaly. Bowel sounds positive.  Musculoskeletal: no clubbing / cyanosis. No joint deformity upper and lower extremities. Good ROM, no contractures. Normal muscle tone.  Skin: no rashes, lesions, ulcers. No induration Neurologic: CN 2-12 grossly intact. Sensation intact, DTR normal. Strength 5/5 in all 4.  Psychiatric: Normal judgment and insight. Alert and oriented x 3. Normal mood.    Labs on Admission: I have personally reviewed following labs and imaging studies  CBC: Recent Labs  Lab 10/19/18 1330  WBC 10.1  NEUTROABS 8.4*  HGB 14.2  HCT 42.5  MCV 84.5  PLT 626   Basic Metabolic Panel: Recent Labs  Lab 10/19/18 1421  NA 128*  K 3.9  CL 93*  CO2 21*  GLUCOSE 99  BUN 6*  CREATININE 0.79  CALCIUM 9.0   GFR: Estimated Creatinine Clearance: 41.5 mL/min (by C-G formula based on SCr of 0.79 mg/dL). Liver Function Tests: No results for input(s): AST, ALT, ALKPHOS, BILITOT, PROT, ALBUMIN in the last 168 hours. No results for input(s): LIPASE, AMYLASE in the last 168 hours. No results for input(s): AMMONIA in the last 168 hours. Coagulation Profile: No results for input(s): INR, PROTIME in the last 168 hours. Cardiac Enzymes: No results for input(s): CKTOTAL, CKMB, CKMBINDEX, TROPONINI in the last 168 hours. BNP (last 3 results) No results for input(s): PROBNP in the last 8760 hours. HbA1C: No results for input(s): HGBA1C in the last 72 hours. CBG: No results for input(s): GLUCAP in the last 168 hours. Lipid Profile: No results for input(s): CHOL, HDL, LDLCALC, TRIG, CHOLHDL, LDLDIRECT in the last 72 hours. Thyroid Function Tests: No  results for input(s): TSH, T4TOTAL, FREET4, T3FREE, THYROIDAB in the last 72 hours. Anemia Panel: No results for input(s): VITAMINB12, FOLATE, FERRITIN, TIBC, IRON, RETICCTPCT in the last 72 hours. Urine analysis: No results found for: COLORURINE, APPEARANCEUR, LABSPEC, PHURINE, GLUCOSEU, HGBUR, BILIRUBINUR, KETONESUR, PROTEINUR, UROBILINOGEN, NITRITE, LEUKOCYTESUR  Radiological Exams on Admission: Ct Angio Chest Pe W And/or Wo Contrast  Result Date: 10/19/2018 CLINICAL DATA:  Chest pain, shortness of breath EXAM: CT ANGIOGRAPHY CHEST WITH CONTRAST TECHNIQUE: Multidetector CT imaging of the chest was performed using the standard protocol during bolus administration of intravenous contrast. Multiplanar CT image reconstructions and MIPs were obtained to evaluate the vascular anatomy. CONTRAST:  24mL OMNIPAQUE IOHEXOL 350 MG/ML SOLN COMPARISON:  Radiograph same day, CT March 23, 2016 FINDINGS: Cardiovascular: There is a optimal opacification of the pulmonary arteries. There is no central,segmental, or subsegmental filling  defects within the pulmonary arteries. There is mild cardiomegaly. A small pericardial effusion is seen. There is normal three-vessel brachiocephalic anatomy without proximal stenosis. The thoracic aorta is normal in appearance. Mediastinum/Nodes: No hilar, mediastinal, or axillary adenopathy. Thyroid gland, trachea, and esophagus demonstrate no significant findings. Lungs/Pleura: A large right and small to moderate left pleural effusion is seen. There is adjacent area of consolidation seen at the right lung base and left lung base. Streaky atelectasis seen at the anterior left lung base. Upper Abdomen: No acute abnormalities present in the visualized portions of the upper abdomen. Musculoskeletal: No chest wall abnormality. No acute or significant osseous findings. Review of the MIP images confirms the above findings. IMPRESSION: 1. Large right and small to moderate left pleural effusion.  2. Adjacent bi basilar areas of consolidation which could be due to compressive atelectasis versus multifocal infectious etiology. 3. Small pericardial effusion. 4. Cardiomegaly. 5. No central, segmental, or subsegmental pulmonary embolism. Electronically Signed   By: Jonna Clark M.D.   On: 10/19/2018 20:11   Dg Chest Portable 1 View  Result Date: 10/19/2018 CLINICAL DATA:  Dyspnea, dry cough EXAM: PORTABLE CHEST 1 VIEW COMPARISON:  10/14/2018 FINDINGS: Moderate right pleural effusion, mildly increased. Small left pleural effusion, grossly unchanged. Bilateral lower lobe opacities, likely atelectasis. No frank interstitial edema.  No pneumothorax. Heart is top-normal in size. IMPRESSION: Moderate right and small left pleural effusions, mildly increased. No frank interstitial edema. Bilateral lower lobe opacities, likely atelectasis. Electronically Signed   By: Charline Bills M.D.   On: 10/19/2018 13:34    EKG: Independently reviewed.  Assessment/Plan Principal Problem:   Bilateral pleural effusion Active Problems:   Essential hypertension    1. Bilateral pleural effusions, R>L - 1. DDx is broad and includes parapneumonic effusion, CHF, malignant effusions, etc. 2. Getting rocephin / azithro for possible parapneumonic effusions, will leave patient on this for the moment. 3. BCx pending 4. IR to tap in AM (presumably the larger R effusion) for diagnostic and theraputic purpose. 1. Labs ordered on effusion 5. Checking quantiferon 6. 2D echo ordered for here 2. HTN - 1. Continue home BP meds  DVT prophylaxis: SCDs Code Status: Full Family Communication: Daughter at bedside Disposition Plan: Home after admit Consults called: None Admission status: Place in 9    Romano Stigger M. DO [facility]  How to contact the [facility] Attending or Consulting provider 7A - 7P or covering provider during after hours 7P -7A, for this patient?  1. Check the care team in [facility] and look for a)  attending/consulting [facility] provider listed and b) the [facility] team listed 2. Log into [email or web address]  Amion Physician Scheduling and messaging for groups and whole hospitals  On call and physician scheduling software for group practices, residents, hospitalists and other medical providers for call, clinic, rotation and shift schedules. OnCall Enterprise is a hospital-wide system for scheduling doctors and paging doctors on call. EasyPlot is for scientific plotting and data analysis.  [email or web address]  and use [facility]'s universal password to access. If you do not have the password, please contact the hospital operator.  3. Locate the [facility] provider you are looking for under [facility] and page to a number that you can be directly reached. 4. If you still have difficulty reaching the provider, please page the [facility] (Director on Call) for the Hospitalists listed on amion for assistance.  10/19/2018, 9:53 PM

## 2018-10-19 NOTE — ED Provider Notes (Signed)
[facility] Provider Note   CSN: [id number] Arrival date & time: 10/19/18  1151     History   Chief Complaint Chief Complaint  Patient presents with   Cough   Shortness of Breath    HPI Rebecca Foster is a 84 y.o. female.  HPI   84 year old female with dyspnea and fatigue.  Language barrier.  History primarily through daughter via telephone.  About a 1 week history of dyspnea.  Occasional dry cough.  Also stating several times that "she cannot keep anything down" but, when questioned further describes anorexia, and not actually vomiting.  No fever.  Some chest discomfort.  No unusual swelling.  No reported sick contacts.  Reports started on Levaquin for possible respiratory infection yesterday.  Of note, had cardiology visit with Dr. Consuello Bossierevanker 4 days ago. Referred there are abnormal EKG. Note says completely asymptomatic from cardiovascular standpoint. Complained of fatigue and daughter also mentioned poor appetite then too. Plan was to reduce beta-blocker by half, obtain ECHO and follow-up in a month.   Past Medical History:  Diagnosis Date   Hypertension     Patient Active Problem List   Diagnosis Date Noted   Nonspecific abnormal electrocardiogram (ECG) (EKG) 10/15/2018   Essential hypertension 10/15/2018    Past Surgical History:  Procedure Laterality Date   NO PAST SURGERIES       OB History   No obstetric history on file.      Home Medications    Prior to Admission medications   Medication Sig Start Date End Date Taking? Authorizing Provider  amLODipine (NORVASC) 2.5 MG tablet Take 2.5 mg by mouth daily.  08/30/16   [provider]  aspirin EC 81 MG tablet Take 81 mg by mouth daily.    [provider]  levothyroxine (SYNTHROID) 25 MCG tablet Take 1 tablet by mouth daily. 08/24/18   [provider]  metoprolol succinate (TOPROL-XL) 50 MG 24 hr tablet Take 1 tablet (50 mg total) by mouth 2  (two) times daily. Take with or immediately following a meal. 10/15/18   Revankar, Aundra Dubinajan R, MD    Family History Family History  Problem Relation Age of Onset   Heart attack Neg Hx    Stroke Neg Hx     Social History Social History   Tobacco Use   Smoking status: Never Smoker   Smokeless tobacco: Never Used  Substance Use Topics   Alcohol use: No   Drug use: No     Allergies   Amoxicillin and Codeine   Review of Systems Review of Systems  All systems reviewed and negative, other than as noted in HPI.  Physical Exam Updated Vital Signs BP (!) 169/85    Pulse 81    Temp 98.6 F (37 C) (Oral)    Resp (!) 24    Ht 5\' 2"  (1.575 m)    Wt 48.5 kg    SpO2 94%    BMI 19.57 kg/m   Physical Exam Vitals signs and nursing note reviewed.  Constitutional:      General: She is not in acute distress.    Appearance: She is well-developed.  HENT:     Head: Normocephalic and atraumatic.  Eyes:     General:        Right eye: No discharge.        Left eye: No discharge.     Conjunctiva/sclera: Conjunctivae normal.  Neck:     Musculoskeletal: Neck supple.  Cardiovascular:  Rate and Rhythm: Normal rate and regular rhythm.     Heart sounds: Normal heart sounds. No murmur. No friction rub. No gallop.   Pulmonary:     Effort: Pulmonary effort is normal. No respiratory distress.     Breath sounds: Normal breath sounds.     Comments: Mild tachypnea.  Breath sounds clear bilaterally. Abdominal:     General: There is no distension.     Palpations: Abdomen is soft.     Tenderness: There is no abdominal tenderness.  Musculoskeletal:        General: No tenderness.     Comments: Lower extremities symmetric as compared to each other. No calf tenderness. Negative Homan's. No palpable cords.   Skin:    General: Skin is warm and dry.  Neurological:     Mental Status: She is alert.  Psychiatric:        Behavior: Behavior normal.        Thought Content: Thought content normal.       ED Treatments / Results  Labs (all labs ordered are listed, but only abnormal results are displayed) Labs Reviewed  CBC WITH DIFFERENTIAL/PLATELET - Abnormal; Notable for the following components:      Result Value   Neutro Abs 8.4 (*)    All other components within normal limits  BASIC METABOLIC PANEL - Abnormal; Notable for the following components:   Sodium 128 (*)    Chloride 93 (*)    CO2 21 (*)    BUN 6 (*)    All other components within normal limits  D-DIMER, QUANTITATIVE (NOT AT [facility]) - Abnormal; Notable for the following components:   D-Dimer, Quant 3.97 (*)    All other components within normal limits  HEPATIC FUNCTION PANEL - Abnormal; Notable for the following components:   Albumin 2.4 (*)    All other components within normal limits  CBC - Abnormal; Notable for the following components:   WBC 11.0 (*)    Platelets 403 (*)    All other components within normal limits  BASIC METABOLIC PANEL - Abnormal; Notable for the following components:   Sodium 132 (*)    CO2 20 (*)    Glucose, Bld 115 (*)    Calcium 8.7 (*)    All other components within normal limits  BRAIN NATRIURETIC PEPTIDE - Abnormal; Notable for the following components:   B Natriuretic Peptide 156.5 (*)    All other components within normal limits  BODY FLUID CELL COUNT WITH DIFFERENTIAL - Abnormal; Notable for the following components:   Color, Fluid YELLOW (*)    Appearance, Fluid HAZY (*)    Total Nucleated Cell Count, Fluid 1,225 (*)    Monocyte-Macrophage-Serous Fluid 21 (*)    All other components within normal limits  LACTATE DEHYDROGENASE, PLEURAL OR PERITONEAL FLUID - Abnormal; Notable for the following components:   LD, Fluid 72 (*)    All other components within normal limits  SARS CORONAVIRUS 2 (TAT 6-24 HRS)  CULTURE, BLOOD (ROUTINE X 2)  CULTURE, BLOOD (ROUTINE X 2)  GRAM STAIN  ACID FAST CULTURE WITH REFLEXED SENSITIVITIES  ACID FAST SMEAR (AFB)  CULTURE, BODY  FLUID-BOTTLE  BRAIN NATRIURETIC PEPTIDE  PROTEIN, PLEURAL OR PERITONEAL FLUID  GLUCOSE, PLEURAL OR PERITONEAL FLUID  LACTATE DEHYDROGENASE  PH, BODY FLUID  URINALYSIS, ROUTINE W REFLEX MICROSCOPIC  BASIC METABOLIC PANEL  CYTOLOGY - NON PAP  TROPONIN I (HIGH SENSITIVITY)  TROPONIN I (HIGH SENSITIVITY)  TROPONIN I (HIGH SENSITIVITY)    EKG  EKG Interpretation  Date/Time:  Saturday October 19 2018 12:43:33 EDT Ventricular Rate:  88 PR Interval:    QRS Duration: 95 QT Interval:  384 QTC Calculation: 465 R Axis:   59 Text Interpretation:  Sinus rhythm Low voltage, precordial leads Nonspecific T abnormalities, diffuse leads Confirmed by Raeford Razor [phone]) on 10/19/2018 1:18:40 PM   Radiology Ct Angio Chest Pe W And/or Wo Contrast  Result Date: 10/19/2018 CLINICAL DATA:  Chest pain, shortness of breath EXAM: CT ANGIOGRAPHY CHEST WITH CONTRAST TECHNIQUE: Multidetector CT imaging of the chest was performed using the standard protocol during bolus administration of intravenous contrast. Multiplanar CT image reconstructions and MIPs were obtained to evaluate the vascular anatomy. CONTRAST:  46mL OMNIPAQUE IOHEXOL 350 MG/ML SOLN COMPARISON:  Radiograph same day, CT March 23, 2016 FINDINGS: Cardiovascular: There is a optimal opacification of the pulmonary arteries. There is no central,segmental, or subsegmental filling defects within the pulmonary arteries. There is mild cardiomegaly. A small pericardial effusion is seen. There is normal three-vessel brachiocephalic anatomy without proximal stenosis. The thoracic aorta is normal in appearance. Mediastinum/Nodes: No hilar, mediastinal, or axillary adenopathy. Thyroid gland, trachea, and esophagus demonstrate no significant findings. Lungs/Pleura: A large right and small to moderate left pleural effusion is seen. There is adjacent area of consolidation seen at the right lung base and left lung base. Streaky atelectasis seen at the anterior left  lung base. Upper Abdomen: No acute abnormalities present in the visualized portions of the upper abdomen. Musculoskeletal: No chest wall abnormality. No acute or significant osseous findings. Review of the MIP images confirms the above findings. IMPRESSION: 1. Large right and small to moderate left pleural effusion. 2. Adjacent bi basilar areas of consolidation which could be due to compressive atelectasis versus multifocal infectious etiology. 3. Small pericardial effusion. 4. Cardiomegaly. 5. No central, segmental, or subsegmental pulmonary embolism. Electronically Signed   By: Jonna Clark M.D.   On: 10/19/2018 20:11   Dg Chest Portable 1 View  Result Date: 10/19/2018 CLINICAL DATA:  Dyspnea, dry cough EXAM: PORTABLE CHEST 1 VIEW COMPARISON:  10/14/2018 FINDINGS: Moderate right pleural effusion, mildly increased. Small left pleural effusion, grossly unchanged. Bilateral lower lobe opacities, likely atelectasis. No frank interstitial edema.  No pneumothorax. Heart is top-normal in size. IMPRESSION: Moderate right and small left pleural effusions, mildly increased. No frank interstitial edema. Bilateral lower lobe opacities, likely atelectasis. Electronically Signed   By: Charline Bills M.D.   On: 10/19/2018 13:34    Procedures Procedures (including critical care time)  Medications Ordered in ED Medications - No data to display   Initial Impression / Assessment and Plan / ED Course  I have reviewed the triage vital signs and the nursing notes.  Pertinent labs & imaging results that were available during my care of the patient were reviewed by me and considered in my medical decision making (see chart for details).    84yF with fatigue and dyspnea (?). History from daughter and what is documented in recent note somewhat conflicting. Language barrier makes it harder for me to get a good sense of symptoms from patient directly. Lungs sound clear. o2 sats fine on RA but she is a little bit  tachypneic at rest. EKG today with nonspecific changes. Recent cardiology notes mentions TWI. I cannot readily review this though or any other priors. CXR with small/moderate effusions, stable. I spoke with PCP and she reports that recent started on levaquin for possible pneumonia. Troponin normal x2. Not clear to me if her degree  of dyspnea from these effusions. BNP normal and no clear consolidation. I ended up obtaining d-dimer which is elevated. Will CTa. Also hyponatremia noted. No obviously offending medications. 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 This wouldn't cause dyspnea but could be contributing to some of the other more vague complaints such as fatigue.   CTa w/o PE but fairly large R pleural effusion than I appreciated on plain films. Compressive atelectasis versus infectious. Has already been on levaquin. Not convinced that pneumonia but will continue to treat as such for the time being. Given her degree of symptoms, will admit. May end up needing thoracentesis.   Final Clinical Impressions(s) / ED Diagnoses   Final diagnoses:  Pleural effusion  Dyspnea, unspecified type    ED Discharge Orders    None       Raeford Razor, MD 10/20/18 [phone]

## 2018-10-19 NOTE — ED Notes (Signed)
Notified family member of negative Covid results and patient is allowed visitors.

## 2018-10-19 NOTE — ED Notes (Addendum)
This is pts pcp number who stated may can with questions  [phone]

## 2018-10-20 ENCOUNTER — Other Ambulatory Visit (HOSPITAL_COMMUNITY): Payer: Medicare Other

## 2018-10-20 ENCOUNTER — Observation Stay (HOSPITAL_COMMUNITY): Payer: Medicaid Other

## 2018-10-20 ENCOUNTER — Observation Stay (HOSPITAL_BASED_OUTPATIENT_CLINIC_OR_DEPARTMENT_OTHER): Payer: Medicaid Other

## 2018-10-20 DIAGNOSIS — I5033 Acute on chronic diastolic (congestive) heart failure: Secondary | ICD-10-CM | POA: Diagnosis not present

## 2018-10-20 DIAGNOSIS — Z7982 Long term (current) use of aspirin: Secondary | ICD-10-CM | POA: Diagnosis not present

## 2018-10-20 DIAGNOSIS — R0602 Shortness of breath: Secondary | ICD-10-CM | POA: Diagnosis not present

## 2018-10-20 DIAGNOSIS — I361 Nonrheumatic tricuspid (valve) insufficiency: Secondary | ICD-10-CM | POA: Diagnosis not present

## 2018-10-20 DIAGNOSIS — E039 Hypothyroidism, unspecified: Secondary | ICD-10-CM | POA: Diagnosis present

## 2018-10-20 DIAGNOSIS — J90 Pleural effusion, not elsewhere classified: Secondary | ICD-10-CM | POA: Diagnosis not present

## 2018-10-20 DIAGNOSIS — J189 Pneumonia, unspecified organism: Secondary | ICD-10-CM | POA: Diagnosis not present

## 2018-10-20 DIAGNOSIS — Z79899 Other long term (current) drug therapy: Secondary | ICD-10-CM | POA: Diagnosis not present

## 2018-10-20 DIAGNOSIS — Z23 Encounter for immunization: Secondary | ICD-10-CM | POA: Diagnosis not present

## 2018-10-20 DIAGNOSIS — Z885 Allergy status to narcotic agent status: Secondary | ICD-10-CM | POA: Diagnosis not present

## 2018-10-20 DIAGNOSIS — R05 Cough: Secondary | ICD-10-CM | POA: Diagnosis not present

## 2018-10-20 DIAGNOSIS — I10 Essential (primary) hypertension: Secondary | ICD-10-CM | POA: Diagnosis not present

## 2018-10-20 DIAGNOSIS — Z880 Allergy status to penicillin: Secondary | ICD-10-CM | POA: Diagnosis not present

## 2018-10-20 DIAGNOSIS — R0600 Dyspnea, unspecified: Secondary | ICD-10-CM | POA: Diagnosis not present

## 2018-10-20 DIAGNOSIS — Z20828 Contact with and (suspected) exposure to other viral communicable diseases: Secondary | ICD-10-CM | POA: Diagnosis not present

## 2018-10-20 DIAGNOSIS — I110 Hypertensive heart disease with heart failure: Secondary | ICD-10-CM | POA: Diagnosis present

## 2018-10-20 DIAGNOSIS — Z79890 Hormone replacement therapy (postmenopausal): Secondary | ICD-10-CM | POA: Diagnosis not present

## 2018-10-20 DIAGNOSIS — J918 Pleural effusion in other conditions classified elsewhere: Secondary | ICD-10-CM | POA: Diagnosis present

## 2018-10-20 DIAGNOSIS — R091 Pleurisy: Secondary | ICD-10-CM | POA: Diagnosis not present

## 2018-10-20 DIAGNOSIS — R110 Nausea: Secondary | ICD-10-CM | POA: Diagnosis not present

## 2018-10-20 DIAGNOSIS — I313 Pericardial effusion (noninflammatory): Secondary | ICD-10-CM | POA: Diagnosis present

## 2018-10-20 LAB — GRAM STAIN

## 2018-10-20 LAB — BRAIN NATRIURETIC PEPTIDE: B Natriuretic Peptide: 156.5 pg/mL — ABNORMAL HIGH (ref 0.0–100.0)

## 2018-10-20 LAB — URINALYSIS, ROUTINE W REFLEX MICROSCOPIC
Bacteria, UA: NONE SEEN
Bilirubin Urine: NEGATIVE
Glucose, UA: NEGATIVE mg/dL
Hgb urine dipstick: NEGATIVE
Ketones, ur: 20 mg/dL — AB
Nitrite: NEGATIVE
Protein, ur: NEGATIVE mg/dL
Specific Gravity, Urine: 1.028 (ref 1.005–1.030)
pH: 6 (ref 5.0–8.0)

## 2018-10-20 LAB — CBC
HCT: 37.7 % (ref 36.0–46.0)
Hemoglobin: 12.9 g/dL (ref 12.0–15.0)
MCH: 28.4 pg (ref 26.0–34.0)
MCHC: 34.2 g/dL (ref 30.0–36.0)
MCV: 82.9 fL (ref 80.0–100.0)
Platelets: 403 10*3/uL — ABNORMAL HIGH (ref 150–400)
RBC: 4.55 MIL/uL (ref 3.87–5.11)
RDW: 12.7 % (ref 11.5–15.5)
WBC: 11 10*3/uL — ABNORMAL HIGH (ref 4.0–10.5)
nRBC: 0 % (ref 0.0–0.2)

## 2018-10-20 LAB — LACTATE DEHYDROGENASE: LDH: 138 U/L (ref 98–192)

## 2018-10-20 LAB — BODY FLUID CELL COUNT WITH DIFFERENTIAL
Eos, Fluid: 0 %
Lymphs, Fluid: 55 %
Monocyte-Macrophage-Serous Fluid: 21 % — ABNORMAL LOW (ref 50–90)
Neutrophil Count, Fluid: 15 % (ref 0–25)
Other Cells, Fluid: 9 %
Total Nucleated Cell Count, Fluid: 1225 cu mm — ABNORMAL HIGH (ref 0–1000)

## 2018-10-20 LAB — ECHOCARDIOGRAM COMPLETE
Height: 62 in
Weight: 1712 oz

## 2018-10-20 LAB — BASIC METABOLIC PANEL
Anion gap: 14 (ref 5–15)
BUN: 8 mg/dL (ref 8–23)
CO2: 20 mmol/L — ABNORMAL LOW (ref 22–32)
Calcium: 8.7 mg/dL — ABNORMAL LOW (ref 8.9–10.3)
Chloride: 98 mmol/L (ref 98–111)
Creatinine, Ser: 0.86 mg/dL (ref 0.44–1.00)
GFR calc Af Amer: >60 mL/min (ref >60)
GFR calc non Af Amer: >60 mL/min (ref >60)
Glucose, Bld: 115 mg/dL — ABNORMAL HIGH (ref 70–99)
Potassium: 3.6 mmol/L (ref 3.5–5.1)
Sodium: 132 mmol/L — ABNORMAL LOW (ref 135–145)

## 2018-10-20 LAB — GLUCOSE, PLEURAL OR PERITONEAL FLUID: Glucose, Fluid: 89 mg/dL

## 2018-10-20 LAB — PROTEIN, PLEURAL OR PERITONEAL FLUID: Total protein, fluid: 4.3 g/dL

## 2018-10-20 LAB — LACTATE DEHYDROGENASE, PLEURAL OR PERITONEAL FLUID: LD, Fluid: 72 U/L — ABNORMAL HIGH (ref 3–23)

## 2018-10-20 MED ORDER — INFLUENZA VAC A&B SA ADJ QUAD 0.5 ML IM PRSY
0.5000 mL | PREFILLED_SYRINGE | INTRAMUSCULAR | Status: AC
  Administered 2018-10-21: 09:00:00 0.5 mL via INTRAMUSCULAR
  Filled 2018-10-20: qty 0.5

## 2018-10-20 MED ORDER — POTASSIUM CHLORIDE CRYS ER 20 MEQ PO TBCR
40.0000 meq | EXTENDED_RELEASE_TABLET | Freq: Two times a day (BID) | ORAL | Status: AC
  Administered 2018-10-20 – 2018-10-21 (×2): 40 meq via ORAL
  Filled 2018-10-20 (×2): qty 2

## 2018-10-20 MED ORDER — LIDOCAINE HCL (PF) 1 % IJ SOLN
INTRAMUSCULAR | Status: AC
  Filled 2018-10-20: qty 30

## 2018-10-20 MED ORDER — ENSURE ENLIVE PO LIQD
237.0000 mL | Freq: Two times a day (BID) | ORAL | Status: DC
  Administered 2018-10-20: 09:00:00 237 mL via ORAL

## 2018-10-20 MED ORDER — GUAIFENESIN-DM 100-10 MG/5ML PO SYRP
5.0000 mL | ORAL_SOLUTION | ORAL | Status: DC | PRN
  Administered 2018-10-20 – 2018-10-21 (×3): 5 mL via ORAL
  Filled 2018-10-20 (×3): qty 5

## 2018-10-20 MED ORDER — ENOXAPARIN SODIUM 40 MG/0.4ML ~~LOC~~ SOLN
40.0000 mg | SUBCUTANEOUS | Status: DC
  Administered 2018-10-20 – 2018-10-21 (×2): 40 mg via SUBCUTANEOUS
  Filled 2018-10-20 (×2): qty 0.4

## 2018-10-20 MED ORDER — FUROSEMIDE 10 MG/ML IJ SOLN
20.0000 mg | Freq: Two times a day (BID) | INTRAMUSCULAR | Status: AC
  Administered 2018-10-21 (×2): 20 mg via INTRAVENOUS
  Filled 2018-10-20 (×2): qty 2

## 2018-10-20 MED ORDER — PNEUMOCOCCAL VAC POLYVALENT 25 MCG/0.5ML IJ INJ
0.5000 mL | INJECTION | INTRAMUSCULAR | Status: DC
  Filled 2018-10-20: qty 0.5

## 2018-10-20 MED ORDER — POTASSIUM CHLORIDE CRYS ER 20 MEQ PO TBCR: 40.0000 meq | EXTENDED_RELEASE_TABLET | Freq: Two times a day (BID) | ORAL | Status: DC

## 2018-10-20 MED ORDER — FUROSEMIDE 10 MG/ML IJ SOLN: 20.0000 mg | Freq: Two times a day (BID) | INTRAMUSCULAR | Status: DC

## 2018-10-20 NOTE — ED Notes (Signed)
Pt is a moderate to max assist at this time.  Pt incontinent of urine x 1.  Assisted pt w/ peri care, linen change and applied a brief as pt states she is urinating each time she coughs.  Per daughter at bedside pt normally independent however today has been a long day for pt and she is very tired.

## 2018-10-20 NOTE — Procedures (Signed)
PROCEDURE SUMMARY:  Successful image-guided right thoracentesis. Yielded 800 milliliters of clear yellow fluid - patient with persistent coughing and nausea nearing end of procedure. Procedure was aborted with a small amount of residual pleural fluid remaining on post procedure [place]. Patient tolerated procedure well. EBL: Zero No immediate complications.  Specimen was sent for labs. Post procedure CXR shows no pneumothorax.  Please see imaging section of Epic for full dictation.  Joaquim Nam PA-C 10/20/2018 11:01 AM

## 2018-10-20 NOTE — Progress Notes (Signed)
   10/20/18 0259  MEWS Score  Resp 16  Pulse Rate 98  BP (!) 147/75  Temp 98 F (36.7 C)  Level of Consciousness Alert  SpO2 96 %  O2 Device Room Air  MEWS Score  MEWS RR 0  MEWS Pulse 0  MEWS Systolic 0  MEWS LOC 0  MEWS Temp 0  MEWS Score 0  MEWS Score Color Green  MEWS Assessment  Is this an acute change? No  MEWS Guidelines - (patients age 82 and over)  Red - At High Risk for Deterioration Yellow - At risk for Deterioration  1. Go to room and assess patient 2. Validate data. Is this patient's baseline? If data confirmed: 3. Is this an acute change? 4. Administer prn meds/treatments as ordered. 5. Note Sepsis score 6. Review goals of care 7. Sports coach, RRT nurse and Provider. 8. Ask Provider to come to bedside.  9. Document patient condition/interventions/response. 10. Increase frequency of vital signs and focused assessments to at least q15 minutes x 4, then q30 minutes x2. - If stable, then q1h x3, then q4h x3 and then q8h or dept. routine. - If unstable, contact Provider & RRT nurse. Prepare for possible transfer. 11. Add entry in progress notes using the smart phrase ".MEWS". 1. Go to room and assess patient 2. Validate data. Is this patient's baseline? If data confirmed: 3. Is this an acute change? 4. Administer prn meds/treatments as ordered? 5. Note Sepsis score 6. Review goals of care 7. Sports coach and Provider 8. Call RRT nurse as needed. 9. Document patient condition/interventions/response. 10. Increase frequency of vital signs and focused assessments to at least q2h x2. - If stable, then q4h x2 and then q8h or dept. routine. - If unstable, contact Provider & RRT nurse. Prepare for possible transfer. 11. Add entry in progress notes using the smart phrase ".MEWS".  Green - Likely stable Lavender - Comfort Care Only  1. Continue routine/ordered monitoring.  2. Review goals of care. 1. Continue routine/ordered monitoring. 2. Review  goals of care.

## 2018-10-20 NOTE — Progress Notes (Signed)
  Echocardiogram 2D Echocardiogram has been performed.  Rebecca Foster 10/20/2018, 9:01 AM

## 2018-10-20 NOTE — Plan of Care (Signed)

## 2018-10-20 NOTE — Progress Notes (Addendum)
PROGRESS NOTE    Cedric FishmanSudhaben Tuite  KGM:[id number]:[id number] DOB: 10/04/1934 DOA: 10/19/2018 [facility]  Brief Narrative:Rebecca Foster is a 84 y.o. female with medical history significant of HTN.  Patient has had about 1 week h/o dyspnea, occasional dry cough.  Saw PCP earlier in week, EKG showed T wave flatening in anterior leads and TWI in lateral leads, this new from 2018 apparently.  Saw Dr. Consuello Bossierevanker x4 days ago.  Trop nl.  2d echo ordered for near future.  -CTA chest: large R and small-moderate L pleural effusions -Cardiomegally and small pericardial effusion., Trop neg x2 in ED.   Assessment & Plan:     Bilateral pleural effusions -still remains dyspneic now, symptoms started 1 week ago -Etiology unclear at this time, CHF is a possibility, follow-up echocardiogram, completed this morning -CT noted large right and moderate left pleural effusions -Going down for thoracentesis of right effusion, will send pleural fluid for cell count, LDH, protein, cytology, cultures etc. -Also check urinalysis, rule out proteinuria -If this is transudative, I would be more concerned about CHF, if exudative would need to be worked up for malignancy, consider CT abdomen pelvis -No evidence of pneumonia, will stop antibiotics and monitor  Hypertension -Continue Toprol  Hypothyroidism -Continue Synthroid  DVT prophylaxis: Lovenox Code Status: Full code Family Communication: No family at bedside at this time, per RN daughter just left and will be back soon, left message for dtr Kajal Ells Disposition Plan: Pending above work-up  Consultants:   None   Procedures:   Antimicrobials:    Subjective: -Reports dyspnea on exertion for 1 week associated with dry cough -Denies any abdominal pain nausea or vomiting, denies abdominal distention  Objective: Vitals:   10/20/18 0259 10/20/18 0500 10/20/18 1005 10/20/18 1026  BP: (!) 147/75 (!) 147/75 (!) 151/73 (!) 163/80  Pulse: 98  98    Resp: 16 16    Temp: 98 F (36.7 C) 98 F (36.7 C)    TempSrc: Oral Oral    SpO2: 96% 96%    Weight:      Height:        Intake/Output Summary (Last 24 hours) at 10/20/2018 1336 Last data filed at 10/19/2018 2259 Gross per 24 hour  Intake 350 ml  Output --  Net 350 ml   Filed Weights   10/19/18 1230  Weight: 48.5 kg    Examination:  General exam: Appears calm and comfortable, AAO x3, no distress very pleasant thinly built female Respiratory system: Decreased breath sounds at both bases Cardiovascular system: S1 & S2 heard, RRR. Gastrointestinal system: Abdomen is nondistended, soft and nontender.Normal bowel sounds heard. Central nervous system: Alert and oriented. No focal neurological deficits. Extremities: No edema Skin: No rashes, lesions or ulcers Psychiatry:  Mood & affect appropriate.     Data Reviewed:   CBC: Recent Labs  Lab 10/19/18 1330 10/20/18 1135  WBC 10.1 11.0*  NEUTROABS 8.4*  --   HGB 14.2 12.9  HCT 42.5 37.7  MCV 84.5 82.9  PLT 398 403*   Basic Metabolic Panel: Recent Labs  Lab 10/19/18 1421 10/20/18 1135  NA 128* 132*  K 3.9 3.6  CL 93* 98  CO2 21* 20*  GLUCOSE 99 115*  BUN 6* 8  CREATININE 0.79 0.86  CALCIUM 9.0 8.7*   GFR: Estimated Creatinine Clearance: 38.6 mL/min (by C-G formula based on SCr of 0.86 mg/dL). Liver Function Tests: Recent Labs  Lab 10/19/18 2300  AST 25  ALT 18  ALKPHOS  67  BILITOT 1.0  PROT 6.6  ALBUMIN 2.4*   No results for input(s): LIPASE, AMYLASE in the last 168 hours. No results for input(s): AMMONIA in the last 168 hours. Coagulation Profile: No results for input(s): INR, PROTIME in the last 168 hours. Cardiac Enzymes: No results for input(s): CKTOTAL, CKMB, CKMBINDEX, TROPONINI in the last 168 hours. BNP (last 3 results) No results for input(s): PROBNP in the last 8760 hours. HbA1C: No results for input(s): HGBA1C in the last 72 hours. CBG: No results for input(s): GLUCAP in  the last 168 hours. Lipid Profile: No results for input(s): CHOL, HDL, LDLCALC, TRIG, CHOLHDL, LDLDIRECT in the last 72 hours. Thyroid Function Tests: No results for input(s): TSH, T4TOTAL, FREET4, T3FREE, THYROIDAB in the last 72 hours. Anemia Panel: No results for input(s): VITAMINB12, FOLATE, FERRITIN, TIBC, IRON, RETICCTPCT in the last 72 hours. Urine analysis: No results found for: COLORURINE, APPEARANCEUR, LABSPEC, PHURINE, GLUCOSEU, HGBUR, BILIRUBINUR, KETONESUR, PROTEINUR, UROBILINOGEN, NITRITE, LEUKOCYTESUR Sepsis Labs: @LABRCNTIP (procalcitonin:4,lacticidven:4)  ) Recent Results (from the past 240 hour(s))  SARS CORONAVIRUS 2 (TAT 6-24 HRS) Nasopharyngeal Nasopharyngeal Swab     Status: None   Collection Time: 10/19/18 12:22 PM   Specimen: Nasopharyngeal Swab  Result Value Ref Range Status   SARS Coronavirus 2 NEGATIVE NEGATIVE Final    Comment: (NOTE) SARS-CoV-2 target nucleic acids are NOT DETECTED. The SARS-CoV-2 RNA is generally detectable in upper and lower respiratory specimens during the acute phase of infection. Negative results do not preclude SARS-CoV-2 infection, do not rule out co-infections with other pathogens, and should not be used as the sole basis for treatment or other patient management decisions. Negative results must be combined with clinical observations, patient history, and epidemiological information. The expected result is Negative. Fact Sheet for Patients: SugarRoll.be Fact Sheet for Healthcare Providers: https://www.woods-mathews.com/ This test is not yet approved or cleared by the [place] FDA and  has been authorized for detection and/or diagnosis of SARS-CoV-2 by FDA under an Emergency Use Authorization (EUA). This EUA will remain  in effect (meaning this test can be used) for the duration of the COVID-19 declaration under Section 56 4(b)(1) of the Act, 21 U.S.C. section 360bbb-3(b)(1), unless  the authorization is terminated or revoked sooner. Performed at [facility], [place] [address], [place], [place] [address]   Culture, blood (routine x 2)     Status: None (Preliminary result)   Collection Time: 10/19/18 11:00 PM   Specimen: BLOOD  Result Value Ref Range Status   Specimen Description BLOOD RIGHT WRIST  Final   Special Requests   Final    BOTTLES DRAWN AEROBIC AND ANAEROBIC Blood Culture results may not be optimal due to an inadequate volume of blood received in culture bottles   Culture   Final    NO GROWTH < 12 HOURS Performed at [facility], [place] [address] [place], [place] [address]    Report Status PENDING  Incomplete         Radiology Studies: Dg Chest 1 View  Result Date: 10/20/2018 CLINICAL DATA:  Status post right thoracentesis EXAM: CHEST  1 VIEW COMPARISON:  October 19, 2018 FINDINGS: No pneumothorax after thoracentesis. Bilateral pleural effusions remain, smaller on the right and stable on the left. Opacity underlies the effusions, probably atelectasis. No other changes. IMPRESSION: The right-sided pleural effusion is smaller in the interval. Left-sided pleural effusion is stable. No pneumothorax after thoracentesis. Electronically Signed   By: Dorise Bullion III M.D   On: 10/20/2018  10:57   Ct Angio Chest Pe W And/or Wo Contrast  Result Date: 10/19/2018 CLINICAL DATA:  Chest pain, shortness of breath EXAM: CT ANGIOGRAPHY CHEST WITH CONTRAST TECHNIQUE: Multidetector CT imaging of the chest was performed using the standard protocol during bolus administration of intravenous contrast. Multiplanar CT image reconstructions and MIPs were obtained to evaluate the vascular anatomy. CONTRAST:  75mL OMNIPAQUE IOHEXOL 350 MG/ML SOLN COMPARISON:  Radiograph same day, CT March 23, 2016 FINDINGS: Cardiovascular: There is a optimal opacification of the pulmonary arteries. There is no central,segmental, or subsegmental filling defects within the pulmonary  arteries. There is mild cardiomegaly. A small pericardial effusion is seen. There is normal three-vessel brachiocephalic anatomy without proximal stenosis. The thoracic aorta is normal in appearance. Mediastinum/Nodes: No hilar, mediastinal, or axillary adenopathy. Thyroid gland, trachea, and esophagus demonstrate no significant findings. Lungs/Pleura: A large right and small to moderate left pleural effusion is seen. There is adjacent area of consolidation seen at the right lung base and left lung base. Streaky atelectasis seen at the anterior left lung base. Upper Abdomen: No acute abnormalities present in the visualized portions of the upper abdomen. Musculoskeletal: No chest wall abnormality. No acute or significant osseous findings. Review of the MIP images confirms the above findings. IMPRESSION: 1. Large right and small to moderate left pleural effusion. 2. Adjacent bi basilar areas of consolidation which could be due to compressive atelectasis versus multifocal infectious etiology. 3. Small pericardial effusion. 4. Cardiomegaly. 5. No central, segmental, or subsegmental pulmonary embolism. Electronically Signed   By: Jonna Clark M.D.   On: 10/19/2018 20:11   Dg Chest Portable 1 View  Result Date: 10/19/2018 CLINICAL DATA:  Dyspnea, dry cough EXAM: PORTABLE CHEST 1 VIEW COMPARISON:  10/14/2018 FINDINGS: Moderate right pleural effusion, mildly increased. Small left pleural effusion, grossly unchanged. Bilateral lower lobe opacities, likely atelectasis. No frank interstitial edema.  No pneumothorax. Heart is top-normal in size. IMPRESSION: Moderate right and small left pleural effusions, mildly increased. No frank interstitial edema. Bilateral lower lobe opacities, likely atelectasis. Electronically Signed   By: Charline Bills M.D.   On: 10/19/2018 13:34   US Thoracentesis Asp Pleural Space W/img Guide  Result Date: 10/20/2018 INDICATION: Patient with history of hypertension who presented to the  ED yesterday with complaints of new onset dyspnea, dry cough, weakness. CT chest shows a large right and a small to moderate left pleural effusions. Request IR for diagnostic and therapeutic thoracentesis. EXAM: ULTRASOUND GUIDED RIGHT THORACENTESIS MEDICATIONS: 10 mL 1% lidocaine COMPLICATIONS: None immediate. PROCEDURE: An ultrasound guided thoracentesis was thoroughly discussed with the patient and questions answered. The benefits, risks, alternatives and complications were also discussed. The patient understands and wishes to proceed with the procedure. Written consent was obtained. Ultrasound was performed to localize and mark an adequate pocket of fluid in the right chest. The area was then prepped and draped in the normal sterile fashion. 1% Lidocaine was used for local anesthesia. Under ultrasound guidance a 6 Fr Safe-T-Centesis catheter was introduced. Thoracentesis was performed. The catheter was removed and a dressing applied. FINDINGS: A total of approximately 800 mL of clear yellow fluid was removed. Samples were sent to the laboratory as requested by the clinical team. IMPRESSION: Successful ultrasound guided right thoracentesis yielding 800 mL of pleural fluid. Read by Lynnette Caffey, PA-C Electronically Signed   By: Richarda Overlie M.D.   On: 10/20/2018 11:03        Scheduled Meds:  amLODipine  2.5 mg Oral q morning - 10a  enoxaparin (LOVENOX) injection  40 mg Subcutaneous Q24H   feeding supplement (ENSURE ENLIVE)  237 mL Oral BID BM   [START ON 10/21/2018] influenza vaccine adjuvanted  0.5 mL Intramuscular Tomorrow-1000   levothyroxine  25 mcg Oral QAC breakfast   metoprolol succinate  50 mg Oral BID   [START ON 10/21/2018] pneumococcal 23 valent vaccine  0.5 mL Intramuscular Tomorrow-1000   Continuous Infusions:  azithromycin     cefTRIAXone (ROCEPHIN)  IV       LOS: 0 days    Time spent:    Zannie Cove, MD [facility] Page via [email or web address],  password [email or web address] After 7PM please contact night-coverage  10/20/2018, 1:36 PM

## 2018-10-20 NOTE — Progress Notes (Signed)
Rebecca Foster was admitted to 5w01 via bed.  The patient is alert and oriented x4.  The patient is of [place] nationality, and speaks very little [place] (primary language is [place]).  The patient's daughter is present at the bedside and assisted with interpretation and admission.  Bed is in the lowest position, call bell and telephone are within reach.  Explained to patient and patient's daughter how to use call bell and telephone, understanding indicated.  The patient's daughter noted that the patient follows a strict vegetarian diet with no eggs.  Diet order was modified accordingly to accommodate this request.  Patient denies any further needs at this time.

## 2018-10-21 ENCOUNTER — Ambulatory Visit (HOSPITAL_BASED_OUTPATIENT_CLINIC_OR_DEPARTMENT_OTHER): Payer: Medicare Other

## 2018-10-21 DIAGNOSIS — I5033 Acute on chronic diastolic (congestive) heart failure: Secondary | ICD-10-CM

## 2018-10-21 LAB — BASIC METABOLIC PANEL
Anion gap: 12 (ref 5–15)
BUN: 8 mg/dL (ref 8–23)
CO2: 22 mmol/L (ref 22–32)
Calcium: 8.6 mg/dL — ABNORMAL LOW (ref 8.9–10.3)
Chloride: 98 mmol/L (ref 98–111)
Creatinine, Ser: 0.87 mg/dL (ref 0.44–1.00)
GFR calc Af Amer: >60 mL/min (ref >60)
GFR calc non Af Amer: >60 mL/min (ref >60)
Glucose, Bld: 94 mg/dL (ref 70–99)
Potassium: 4.4 mmol/L (ref 3.5–5.1)
Sodium: 132 mmol/L — ABNORMAL LOW (ref 135–145)

## 2018-10-21 LAB — CYTOLOGY - NON PAP

## 2018-10-21 LAB — ACID FAST SMEAR (AFB, MYCOBACTERIA): Acid Fast Smear: NEGATIVE

## 2018-10-21 MED ORDER — FUROSEMIDE 20 MG PO TABS
20.0000 mg | ORAL_TABLET | Freq: Every day | ORAL | Status: DC
  Administered 2018-10-21 – 2018-10-22 (×2): 20 mg via ORAL
  Filled 2018-10-21 (×2): qty 1

## 2018-10-21 NOTE — Progress Notes (Signed)
Nutrition Brief Note  Patient identified on the Malnutrition Screening Tool (MST) Report  Wt Readings from Last 15 Encounters:  10/19/18 48.5 kg  10/15/18 48 kg  09/08/16 45.6 kg   Rebecca Foster is a 84 y.o. female with medical history significant of HTN.  Patient has had about 1 week h/o dyspnea, occasional dry cough.  No fever until last night she had subjective fever.  Some chest discomfort.  No sick contacts.  Pt admitted with bilateral pleural effusions.   10/18- s/p rt thoracentesis, 800 ml clear yellow fluid removed  Pt receiving nursing care at time of visit.   Wt has been stable.   Body mass index is 19.57 kg/m. Patient meets criteria for normal weight range  based on current BMI.   Current diet order is vegetarian, patient is consuming approximately n/a% of meals at this time. Labs and medications reviewed.   No nutrition interventions warranted at this time. If nutrition issues arise, please consult RD.   Taimi Towe A. Jimmye Norman, RD, LDN, [place] Registered Dietitian II Certified Diabetes Care and Education Specialist Pager: [phone] After hours Pager: [phone]

## 2018-10-21 NOTE — Progress Notes (Signed)
Patient's family contacted me to see if I would accept her as a patient to manage her CHF. I have reviewed her chart and happy to resume care in the OP setting if she goes home tomorrow. If my assistance is needed in the hospital, I am available.   Adrian Prows, MD, [facility] 10/21/2018, 10:16 PM [facility] [place] Pager: [phone] Office: [phone] If no answer Cell [phone]

## 2018-10-21 NOTE — TOC Progression Note (Addendum)
Transition of Care [facility]) - Progression Note    Patient Details  Name: Rebecca Foster MRN: [id number] Date of Birth: 01/31/1934  [facility] CM/SW Contact  Sharin Mons, RN Phone Number: 10/21/2018, 3:51 PM  Clinical Narrative:  Presents with cough/SOB, bilateral pleural effusions. From [place], speaks little english. Resides with daughter Rebecca Foster who speaks english. Daughter states prior to sickness pt was independent with ADL's, no DME usage.   -s/p R thoracentesis ( 800cc), 10/18  PT/OT evaluations pending ....   Rebecca Foster (Daughter)     [phone]      NCM  Following for TOC needs ...  Expected Discharge Plan: Home/Self Care Barriers to Discharge: Continued Medical Work up  Expected Discharge Plan and Services Expected Discharge Plan: Home/Self Care   Discharge Planning Services: CM Consult   Living arrangements for the past 2 months: Single Family Home                                       Social Determinants of Health (SDOH) Interventions    Readmission Risk Interventions No flowsheet data found.

## 2018-10-21 NOTE — Progress Notes (Signed)
PROGRESS NOTE    Rebecca Foster  ZOX:[id number] DOB: August 05, 1934 DOA: 10/19/2018 PCP: Lorenda Ishihara, MD     Brief Narrative:  Rebecca Foster a 84 y.o.femalewith medical history significant ofHTN. Patient has had about 1 week h/o dyspnea, occasional dry cough. Saw PCP earlier in week, EKG showed T wave flatening in anterior leads and TWI in lateral leads, this new from 2018 apparently. Saw Dr. Consuello Bossier x4 days ago. Trop nl. 2d echo ordered for near future.  -CTA chest: large R and small-moderate L pleural effusions -Cardiomegally and small pericardial effusion., Trop neg x2 in ED.   Assessment & Plan: 1-acute on chronic diastolic heart failure -2D echo demonstrating diastolic dysfunction and left ventricular hypertrophy -Status post thoracentesis; final results pending but overall suggesting transudate. -Patient is afebrile, stable WBCs and no complaining of chest pain. -CT angio of the chest rule out the presence of pulmonary embolism. -Will repeat x-ray in the morning -Transition diuretics to oral regimen -Continue blood pressure control -Education regarding low-sodium diet provided.  2-hypertension -Continue metoprolol and Lasix now -Low-sodium diet encouraged.  3-hypothyroidism -Continue Synthroid   DVT prophylaxis: Lovenox Code Status: Full code Family Communication: Daughter at bedside. Disposition Plan: Diuretics will be transitioned to oral regimen, continue to assess urine output and response.  Follow final results on pleural fluid analysis.  Repeat x-ray in the morning.  Anticipate discharge home the next 24 hours.  Consultants:   None  Procedures:   2D echo: Preserved ejection fraction, positive left ventricle hypertrophy and diastolic dysfunction.  No wall motion and normalities or significant valvular disorder.  Antimicrobials:  Anti-infectives (From admission, onward)   Start     Dose/Rate Route Frequency Ordered Stop   10/20/18 2030   cefTRIAXone (ROCEPHIN) 1 g in sodium chloride 0.9 % 100 mL IVPB  Status:  Discontinued     1 g 200 mL/hr over 30 Minutes Intravenous Every 24 hours 10/19/18 2156 10/20/18 1342   10/20/18 2030  azithromycin (ZITHROMAX) 500 mg in sodium chloride 0.9 % 250 mL IVPB  Status:  Discontinued     500 mg 250 mL/hr over 60 Minutes Intravenous Every 24 hours 10/19/18 2156 10/20/18 1342   10/19/18 2030  cefTRIAXone (ROCEPHIN) 1 g in sodium chloride 0.9 % 100 mL IVPB     1 g 200 mL/hr over 30 Minutes Intravenous  Once 10/19/18 2020 10/19/18 2222   10/19/18 2030  azithromycin (ZITHROMAX) 500 mg in sodium chloride 0.9 % 250 mL IVPB     500 mg 250 mL/hr over 60 Minutes Intravenous  Once 10/19/18 2020 10/19/18 2259      Subjective: Afebrile, no chest pain, no nausea, no vomiting.  Reports breathing is improving.  Currently with good O2 sat on room air.  Objective: Vitals:   10/20/18 2057 10/21/18 0611 10/21/18 0850 10/21/18 1535  BP: 140/70 114/69 134/72 128/78  Pulse: 94 78 87 72  Resp: Temp: 98.2 F (36.8 C) 97.8 F (36.6 C) (!) 97.4 F (36.3 C) 98 F (36.7 C)  TempSrc:  Oral Oral Oral  SpO2: 96% 98%  98%  Weight:      Height:       No intake or output data in the 24 hours ending 10/21/18 1623 Filed Weights   10/19/18 1230  Weight: 48.5 kg    Examination: General exam: Alert, awake, oriented x 3; reports no nausea, no vomiting, no chest pain.  Breathing improving.  [place] requiring oxygen supplementation. Respiratory system: Positive rhonchi, no wheezing,  no using accessory muscles.  Good O2 sat on room air. Cardiovascular system: RRR. No murmurs, rubs, gallops. Gastrointestinal system: Abdomen is nondistended, soft and nontender. No organomegaly or masses felt. Normal bowel sounds heard. Central nervous system: Alert and oriented. No focal neurological deficits. Extremities: No cyanosis or clubbing.  No edema on exam. Skin: No rashes, lesions or ulcers Psychiatry:  Judgement and insight appear normal. Mood & affect appropriate.     Data Reviewed: I have personally reviewed following labs and imaging studies  CBC: Recent Labs  Lab 10/19/18 1330 10/20/18 1135  WBC 10.1 11.0*  NEUTROABS 8.4*  --   HGB 14.2 12.9  HCT 42.5 37.7  MCV 84.5 82.9  PLT 398 952*   Basic Metabolic Panel: Recent Labs  Lab 10/19/18 1421 10/20/18 1135 10/21/18 0233  NA 128* 132* 132*  K 3.9 3.6 4.4  CL 93* 98 98  CO2 21* 20* 22  GLUCOSE 99 115* 94  BUN 6* 8 8  CREATININE 0.79 0.86 0.87  CALCIUM 9.0 8.7* 8.6*   GFR: Estimated Creatinine Clearance: 38.2 mL/min (by C-G formula based on SCr of 0.87 mg/dL).   Liver Function Tests: Recent Labs  Lab 10/19/18 2300  AST 25  ALT 18  ALKPHOS 67  BILITOT 1.0  PROT 6.6  ALBUMIN 2.4*   Urine analysis:    Component Value Date/Time   COLORURINE YELLOW 10/20/2018 1908   APPEARANCEUR CLEAR 10/20/2018 1908   LABSPEC 1.028 10/20/2018 1908   PHURINE 6.0 10/20/2018 [place] NEGATIVE 10/20/2018 [place] NEGATIVE 10/20/2018 [place] NEGATIVE 10/20/2018 1908   KETONESUR 20 (A) 10/20/2018 1908   PROTEINUR NEGATIVE 10/20/2018 1908   NITRITE NEGATIVE 10/20/2018 1908   LEUKOCYTESUR TRACE (A) 10/20/2018 1908    Recent Results (from the past 240 hour(s))  SARS CORONAVIRUS 2 (TAT 6-24 HRS) Nasopharyngeal Nasopharyngeal Swab     Status: None   Collection Time: 10/19/18 12:22 PM   Specimen: Nasopharyngeal Swab  Result Value Ref Range Status   SARS Coronavirus 2 NEGATIVE NEGATIVE Final    Comment: (NOTE) SARS-CoV-2 target nucleic acids are NOT DETECTED. The SARS-CoV-2 RNA is generally detectable in upper and lower respiratory specimens during the acute phase of infection. Negative results do not preclude SARS-CoV-2 infection, do not rule out co-infections with other pathogens, and should not be used as the sole basis for treatment or other patient management decisions. Negative results must be  combined with clinical observations, patient history, and epidemiological information. The expected result is Negative. Fact Sheet for Patients: SugarRoll.be Fact Sheet for Healthcare Providers: https://www.woods-mathews.com/ This test is not yet approved or cleared by the [place] FDA and  has been authorized for detection and/or diagnosis of SARS-CoV-2 by FDA under an Emergency Use Authorization (EUA). This EUA will remain  in effect (meaning this test can be used) for the duration of the COVID-19 declaration under Section 56 4(b)(1) of the Act, 21 U.S.C. section 360bbb-3(b)(1), unless the authorization is terminated or revoked sooner. Performed at [facility], [place] [address] [place], [place] [address]   Culture, blood (routine x 2)     Status: None (Preliminary result)   Collection Time: 10/19/18 11:00 PM   Specimen: BLOOD  Result Value Ref Range Status   Specimen Description BLOOD RIGHT WRIST  Final   Special Requests   Final    BOTTLES DRAWN AEROBIC AND ANAEROBIC Blood Culture results may not be optimal due to an inadequate volume of blood received  in culture bottles   Culture   Final    NO GROWTH 2 DAYS Performed at [facility], [address], [place], [place] [address]    Report Status PENDING  Incomplete  Gram stain     Status: None   Collection Time: 10/20/18 10:42 AM   Specimen: PATH Cytology Pleural fluid  Result Value Ref Range Status   Specimen Description PLEURAL RIGHT  Final   Special Requests NONE  Final   Gram Stain   Final    RARE WBC PRESENT, PREDOMINANTLY MONONUCLEAR NO ORGANISMS SEEN Performed at [facility], [address] [place], [place] [address]    Report Status 10/20/2018 FINAL  Final  Culture, body fluid-bottle     Status: None (Preliminary result)   Collection Time: 10/20/18 10:42 AM   Specimen: Pleura  Result Value Ref Range Status   Specimen Description PLEURAL RIGHT   Final   Special Requests NONE  Final   Culture   Final    NO GROWTH < 24 HOURS Performed at [facility], [address], [place], [place] [address]    Report Status PENDING  Incomplete  Culture, blood (routine x 2)     Status: None (Preliminary result)   Collection Time: 10/20/18 11:35 AM   Specimen: BLOOD  Result Value Ref Range Status   Specimen Description BLOOD SITE NOT SPECIFIED  Final   Special Requests   Final    BOTTLES DRAWN AEROBIC AND ANAEROBIC Blood Culture adequate volume   Culture   Final    NO GROWTH < 24 HOURS Performed at [facility], [address] [place], [place] [address]    Report Status PENDING  Incomplete     Radiology Studies: Dg Chest 1 View  Result Date: 10/20/2018 CLINICAL DATA:  Status post right thoracentesis EXAM: CHEST  1 VIEW COMPARISON:  October 19, 2018 FINDINGS: No pneumothorax after thoracentesis. Bilateral pleural effusions remain, smaller on the right and stable on the left. Opacity underlies the effusions, probably atelectasis. No other changes. IMPRESSION: The right-sided pleural effusion is smaller in the interval. Left-sided pleural effusion is stable. No pneumothorax after thoracentesis. Electronically Signed   By: Gerome Sam III M.D   On: 10/20/2018 10:57   Ct Angio Chest Pe W And/or Wo Contrast  Result Date: 10/19/2018 CLINICAL DATA:  Chest pain, shortness of breath EXAM: CT ANGIOGRAPHY CHEST WITH CONTRAST TECHNIQUE: Multidetector CT imaging of the chest was performed using the standard protocol during bolus administration of intravenous contrast. Multiplanar CT image reconstructions and MIPs were obtained to evaluate the vascular anatomy. CONTRAST:  83mL OMNIPAQUE IOHEXOL 350 MG/ML SOLN COMPARISON:  Radiograph same day, CT March 23, 2016 FINDINGS: Cardiovascular: There is a optimal opacification of the pulmonary arteries. There is no central,segmental, or subsegmental filling defects within the pulmonary arteries. There  is mild cardiomegaly. A small pericardial effusion is seen. There is normal three-vessel brachiocephalic anatomy without proximal stenosis. The thoracic aorta is normal in appearance. Mediastinum/Nodes: No hilar, mediastinal, or axillary adenopathy. Thyroid gland, trachea, and esophagus demonstrate no significant findings. Lungs/Pleura: A large right and small to moderate left pleural effusion is seen. There is adjacent area of consolidation seen at the right lung base and left lung base. Streaky atelectasis seen at the anterior left lung base. Upper Abdomen: No acute abnormalities present in the visualized portions of the upper abdomen. Musculoskeletal: No chest wall abnormality. No acute or significant osseous findings. Review of the MIP images confirms  the above findings. IMPRESSION: 1. Large right and small to moderate left pleural effusion. 2. Adjacent bi basilar areas of consolidation which could be due to compressive atelectasis versus multifocal infectious etiology. 3. Small pericardial effusion. 4. Cardiomegaly. 5. No central, segmental, or subsegmental pulmonary embolism. Electronically Signed   By: Jonna ClarkBindu  Avutu M.D.   On: 10/19/2018 20:11   [place] Thoracentesis Asp Pleural Space W/img Guide  Result Date: 10/20/2018 INDICATION: Patient with history of hypertension who presented to the ED yesterday with complaints of new onset dyspnea, dry cough, weakness. CT chest shows a large right and a small to moderate left pleural effusions. Request IR for diagnostic and therapeutic thoracentesis. EXAM: ULTRASOUND GUIDED RIGHT THORACENTESIS MEDICATIONS: 10 mL 1% lidocaine COMPLICATIONS: None immediate. PROCEDURE: An ultrasound guided thoracentesis was thoroughly discussed with the patient and questions answered. The benefits, risks, alternatives and complications were also discussed. The patient understands and wishes to proceed with the procedure. Written consent was obtained. Ultrasound was performed to localize  and mark an adequate pocket of fluid in the right chest. The area was then prepped and draped in the normal sterile fashion. 1% Lidocaine was used for local anesthesia. Under ultrasound guidance a 6 Fr Safe-T-Centesis catheter was introduced. Thoracentesis was performed. The catheter was removed and a dressing applied. FINDINGS: A total of approximately 800 mL of clear yellow fluid was removed. Samples were sent to the laboratory as requested by the clinical team. IMPRESSION: Successful ultrasound guided right thoracentesis yielding 800 mL of pleural fluid. Read by Lynnette CaffeyShannon Watterson, PA-C Electronically Signed   By: Richarda OverlieAdam  Henn M.D.   On: 10/20/2018 11:03    Scheduled Meds: . enoxaparin (LOVENOX) injection  40 mg Subcutaneous Q24H  . furosemide  20 mg Oral Daily  . levothyroxine  25 mcg Oral QAC breakfast  . metoprolol succinate  50 mg Oral BID  . pneumococcal 23 valent vaccine  0.5 mL Intramuscular Tomorrow-1000   Continuous Infusions:   LOS: 1 day    Time spent: 30 minutes.   Vassie Lollarlos Kalyani Maeda, MD [facility] Pager [phone]   10/21/2018, 4:23 PM

## 2018-10-22 ENCOUNTER — Inpatient Hospital Stay (HOSPITAL_COMMUNITY): Payer: Medicaid Other

## 2018-10-22 DIAGNOSIS — J90 Pleural effusion, not elsewhere classified: Secondary | ICD-10-CM

## 2018-10-22 DIAGNOSIS — I10 Essential (primary) hypertension: Secondary | ICD-10-CM

## 2018-10-22 DIAGNOSIS — J189 Pneumonia, unspecified organism: Secondary | ICD-10-CM

## 2018-10-22 LAB — PH, BODY FLUID: pH, Body Fluid: 7.8

## 2018-10-22 MED ORDER — POTASSIUM CHLORIDE ER 10 MEQ PO TBCR: 10.0000 meq | EXTENDED_RELEASE_TABLET | Freq: Every day | ORAL | 0 refills | Status: DC

## 2018-10-22 MED ORDER — FUROSEMIDE 20 MG PO TABS: 20.0000 mg | ORAL_TABLET | Freq: Every day | ORAL | 0 refills | Status: DC

## 2018-10-22 NOTE — Progress Notes (Signed)
Occupational Therapy Evaluation Patient Details Name: Rebecca Foster MRN: [id number] DOB: 15-Aug-1934 Today's Date: 10/22/2018    History of Present Illness 84 y.o. female with medical history significant of HTN.  Patient has had about 1 week h/o dyspnea, occasional dry cough. CTA chest: large R and small-moderate L pleural effusions   Clinical Impression   PTA, pt lived at home with her daughter and was independent with ADL and mobility. Pt initially unsteady with mobility, but after several repetitions, able to mobilize and complete ADL with min guard A @ RW level. Pt tearful about her concern for falling and her desire to return to her PLOF. Recommend HHOT , 3 in1 and RW for follow up. Daughter states family can provide initial 24/7 S. Daughter asking about fall alert systems - CM notified. All further OT to be addressed by HHOT.     Follow Up Recommendations  Home health OT;Supervision/Assistance - 24 hour(initially)    Equipment Recommendations  3 in 1 bedside commode;Other (comment)(RW)    Recommendations for Other Services       Precautions / Restrictions Precautions Precautions: Fall Restrictions Weight Bearing Restrictions: No      Mobility Bed Mobility Overal bed mobility: Needs Assistance Bed Mobility: Supine to Sit;Sit to Supine     Supine to sit: Supervision Sit to supine: Supervision      Transfers Overall transfer level: Needs assistance Equipment used: Rolling walker (2 wheeled) Transfers: Sit to/from Stand Sit to Stand: Min assist         General transfer comment: Pt initially unsteady adn complaining about feeling "giddy". After several repetitions, pt was functioning at min guard level    Balance Overall balance assessment: Needs assistance   Sitting balance-Leahy Scale: Good       Standing balance-Leahy Scale: Fair Standing balance comment: feels safer using RW                           ADL either performed or assessed with  clinical judgement   ADL Overall ADL's : Needs assistance/impaired     Grooming: Min guard;Standing   Upper Body Bathing: Set up;Sitting   Lower Body Bathing: Min guard;Sit to/from stand   Upper Body Dressing : Set up;Sitting   Lower Body Dressing: Min guard;Sit to/from stand   Toilet Transfer: Minimal assistance;RW;BSC   Toileting- Architect and Hygiene: Min guard;Sit to/from stand       Functional mobility during ADLs: Minimal assistance;Rolling walker;Cueing for safety(VC for hand placement and safe transfer technique           ) General ADL Comments: Educated pt/daughter on fall prevention strategies; daughter asking about home alert system - CM notified     Vision   Additional Comments: reports no visual impairment     Perception     Praxis      Pertinent Vitals/Pain Pain Assessment: No/denies pain     Hand Dominance Right   Extremity/Trunk Assessment Upper Extremity Assessment Upper Extremity Assessment: Generalized weakness   Lower Extremity Assessment Lower Extremity Assessment: Defer to PT evaluation   Cervical / Trunk Assessment Cervical / Trunk Assessment: Normal   Communication Communication Communication: Prefers language other than English   Cognition Arousal/Alertness: Awake/alert Behavior During Therapy: WFL for tasks assessed/performed Overall Cognitive Status: Within Functional Limits for tasks assessed  General Comments       Exercises Exercises: Other exercises;General Upper Extremity;General Lower Extremity General Exercises - Upper Extremity Shoulder Flexion: AROM;Strengthening;Both;10 reps;Seated Shoulder ABduction: AROM;Strengthening;Both;10 reps;Seated General Exercises - Lower Extremity Short Arc Quad: Strengthening;Both;10 reps;Seated Hip Flexion/Marching: Strengthening;Both;10 reps;Seated   Shoulder Instructions      Home Living Family/patient expects to be  discharged to:: Private residence Living Arrangements: Spouse/significant other Available Help at Discharge: Available PRN/intermittently(daughter states someone will be with her initially 24/7) Type of Home: House Home Access: Stairs to enter [employer] of Steps: 2   Home Layout: One level     Bathroom Shower/Tub: Occupational psychologist: Standard Bathroom Accessibility: No   Home Equipment: None          Prior Functioning/Environment Level of Independence: Independent                 OT Problem List: Decreased strength;Decreased activity tolerance;Decreased knowledge of use of DME or AE;Cardiopulmonary status limiting activity      OT Treatment/Interventions:      OT Goals(Current goals can be found in the care plan section) Acute Rehab OT Goals Patient Stated Goal: to be safe and not fall OT Goal Formulation: With patient/family  OT Frequency:     Barriers to D/C:            Co-evaluation              AM-PAC OT "6 Clicks" Daily Activity     Outcome Measure Help from another person eating meals?: None Help from another person taking care of personal grooming?: A Little Help from another person toileting, which includes using toliet, bedpan, or urinal?: A Little Help from another person bathing (including washing, rinsing, drying)?: A Little Help from another person to put on and taking off regular upper body clothing?: A Little Help from another person to put on and taking off regular lower body clothing?: A Little 6 Click Score: 19   End of Session Equipment Utilized During Treatment: Gait belt;Rolling walker Nurse Communication: Mobility status;Other (comment)(DC needs)  Activity Tolerance: Patient tolerated treatment well Patient left: in bed;with call bell/phone within reach;with family/visitor present  OT Visit Diagnosis: Unsteadiness on feet (R26.81);Muscle weakness (generalized) (M62.81)                Time:  3354-5625 OT Time Calculation (min): 28 min Charges:  OT General Charges $OT Visit: 1 Visit OT Evaluation $OT Eval Low Complexity: 1 Low OT Treatments $Self Care/Home Management : 8-22 mins  Maurie Boettcher, OT/L   Acute OT Clinical Specialist Acute Rehabilitation Services Pager [phone] Office [phone]   [facility] 10/22/2018, 11:52 AM

## 2018-10-22 NOTE — Progress Notes (Signed)
Patient discharged home per MD order; discharge instructions reviewed with patient and her daughter and given to daughter; IVs DIC; Scripts sent to patients preferred pharmacy; skin intact; patient escorted to the car by nurse tech via wheelchair.

## 2018-10-22 NOTE — Discharge Summary (Signed)
Physician Discharge Summary  Analyce Foster UEA:[id number] DOB: 15-Oct-1934 DOA: 10/19/2018  PCP: Lorenda Ishihara, MD  Admit date: 10/19/2018 Discharge date: 10/22/2018  Admitted From: Inpatient Disposition: home  Recommendations for Outpatient Follow-up:  1. Follow up with PCP in 1-2 weeks 2. Follow-up with cardiology as discussed with cardiology  Home Health:No Equipment/Devices:none  Discharge Condition:Stable CODE STATUS:Full code Diet recommendation: Cardiac diet  Brief/Interim Summary: Rebecca Bhattis a 84 y.o.femalewith medical history significant ofHTN. Patient has had about 1 week h/o dyspnea, occasional dry cough. Saw PCP earlier in week, EKG showed T wave flatening in anterior leads and TWI in lateral leads, this new from 2018 apparently. Saw Dr. Consuello Bossier x4 days ago. Trop nl. 2d echo ordered for near future. -CTA chest: large R and small-moderate L pleural effusions -Cardiomegally and small pericardial effusion.,Trop neg x2 in ED.  Hospital course: 1-acute on chronic diastolic heart failure -2D echo demonstrating diastolic dysfunction and left ventricular hypertrophy -Status post thoracentesis; consistent with transudate of negative Gram stain negative culture -CT angio of the chest ruled out the presence of pulmonary embolism. -Morning x-rays pending if negative for acute changes stable for discharge -Transitioned diuretics to oral regimen which will be continued as outpatient, added supplementation with potassium of 10 mg daily and discussed with family importance of close follow-up with PCP for lab monitoring of potassium sodium and renal function -Also provided family with CHF pathway outpatient management -Continue blood pressure control will discontinue amlodipine with the addition of Lasix continue patient's home beta-blocker -Education regarding low-sodium diet provided.  2-hypertension -Continue metoprolol and Lasix now -Low-sodium diet  encouraged.  3-hypothyroidism -Continue Synthroid -No evidence of obvious hormonal dysregulation  Discharge Diagnoses:  Principal Problem:   Bilateral pleural effusion Active Problems:   Essential hypertension   Pleural effusion, bilateral    Discharge Instructions  Discharge Instructions    (HEART FAILURE PATIENTS) Call MD:  Anytime you have any of the following symptoms: 1) 3 pound weight gain in 24 hours or 5 pounds in 1 week 2) shortness of breath, with or without a dry hacking cough 3) swelling in the hands, feet or stomach 4) if you have to sleep on extra pillows at night in order to breathe.   Complete by: As directed    Call MD for:   Complete by: As directed    Any acute change in medical condition   Call MD for:  difficulty breathing, headache or visual disturbances   Complete by: As directed    Call MD for:  extreme fatigue   Complete by: As directed    Call MD for:  hives   Complete by: As directed    Call MD for:  persistant dizziness or light-headedness   Complete by: As directed    Call MD for:  persistant nausea and vomiting   Complete by: As directed    Call MD for:  redness, tenderness, or signs of infection (pain, swelling, redness, odor or green/yellow discharge around incision site)   Complete by: As directed    Call MD for:  severe uncontrolled pain   Complete by: As directed    Call MD for:  temperature >100.4   Complete by: As directed    Diet - low sodium heart healthy   Complete by: As directed    Increase activity slowly   Complete by: As directed      Allergies as of 10/22/2018      Reactions   Amoxicillin Other (See Comments)   Extreme fatigue Did  it involve swelling of the face/tongue/throat, SOB, or low BP? No Did it involve sudden or severe rash/hives, skin peeling, or any reaction on the inside of your mouth or nose? No Did you need to seek medical attention at a hospital or doctor's office? No When did it last happen?84 ish  years old If all above answers are "NO", may proceed with cephalosporin use.   Codeine Other (See Comments)   Higher dose - sensitive, burning sensation       Medication List    STOP taking these medications   amLODipine 2.5 MG tablet Commonly known as: NORVASC   levofloxacin 250 MG tablet Commonly known as: LEVAQUIN     TAKE these medications   aspirin EC 81 MG tablet Take 81 mg by mouth daily.   furosemide 20 MG tablet Commonly known as: LASIX Take 1 tablet (20 mg total) by mouth daily.   levothyroxine 25 MCG tablet Commonly known as: SYNTHROID Take 25 mcg by mouth daily before breakfast.   Melatonin 3 MG Tabs Take 1.5 mg by mouth at bedtime as needed (sleep).   metoprolol succinate 50 MG 24 hr tablet Commonly known as: TOPROL-XL Take 1 tablet (50 mg total) by mouth 2 (two) times daily. Take with or immediately following a meal. What changed:   when to take this  additional instructions   NYQUIL PO Take 20 mLs by mouth at bedtime as needed (cough and cold symptoms).   potassium chloride 10 MEQ tablet Commonly known as: KLOR-CON Take 1 tablet (10 mEq total) by mouth daily.       Allergies  Allergen Reactions  . Amoxicillin Other (See Comments)    Extreme fatigue Did it involve swelling of the face/tongue/throat, SOB, or low BP? No Did it involve sudden or severe rash/hives, skin peeling, or any reaction on the inside of your mouth or nose? No Did you need to seek medical attention at a hospital or doctor's office? No When did it last happen?84 ish years old If all above answers are "NO", may proceed with cephalosporin use.  . Codeine Other (See Comments)    Higher dose - sensitive, burning sensation     Consultations:  None   Procedures/Studies: Dg Chest 1 View  Result Date: 10/20/2018 CLINICAL DATA:  Status post right thoracentesis EXAM: CHEST  1 VIEW COMPARISON:  October 19, 2018 FINDINGS: No pneumothorax after thoracentesis.  Bilateral pleural effusions remain, smaller on the right and stable on the left. Opacity underlies the effusions, probably atelectasis. No other changes. IMPRESSION: The right-sided pleural effusion is smaller in the interval. Left-sided pleural effusion is stable. No pneumothorax after thoracentesis. Electronically Signed   By: Gerome Sam III M.D   On: 10/20/2018 10:57   Dg Chest 2 View  Result Date: 10/14/2018 CLINICAL DATA:  84 year old female with history of shortness of breath, cough and chest pain. EXAM: CHEST - 2 VIEW COMPARISON:  Chest x-ray 10/14/2018. FINDINGS: Moderate bilateral pleural effusions lying dependently with bibasilar opacities which may reflect areas of atelectasis and/or consolidation period mid to upper lungs appear clear. No definite suspicious appearing pulmonary nodules or masses. No pneumothorax. No evidence of pulmonary edema. Cardiac silhouette is partially obscured. Upper mediastinal contours are within normal limits. IMPRESSION: 1. Moderate bilateral pleural effusions lying dependently with bibasilar areas of atelectasis and/or consolidation. Electronically Signed   By: Trudie Reed M.D.   On: 10/14/2018 16:47   Ct Angio Chest Pe W And/or Wo Contrast  Result Date: 10/19/2018 CLINICAL DATA:  Chest pain, shortness of breath EXAM: CT ANGIOGRAPHY CHEST WITH CONTRAST TECHNIQUE: Multidetector CT imaging of the chest was performed using the standard protocol during bolus administration of intravenous contrast. Multiplanar CT image reconstructions and MIPs were obtained to evaluate the vascular anatomy. CONTRAST:  75mL OMNIPAQUE IOHEXOL 350 MG/ML SOLN COMPARISON:  Radiograph same day, CT March 23, 2016 FINDINGS: Cardiovascular: There is a optimal opacification of the pulmonary arteries. There is no central,segmental, or subsegmental filling defects within the pulmonary arteries. There is mild cardiomegaly. A small pericardial effusion is seen. There is normal three-vessel  brachiocephalic anatomy without proximal stenosis. The thoracic aorta is normal in appearance. Mediastinum/Nodes: No hilar, mediastinal, or axillary adenopathy. Thyroid gland, trachea, and esophagus demonstrate no significant findings. Lungs/Pleura: A large right and small to moderate left pleural effusion is seen. There is adjacent area of consolidation seen at the right lung base and left lung base. Streaky atelectasis seen at the anterior left lung base. Upper Abdomen: No acute abnormalities present in the visualized portions of the upper abdomen. Musculoskeletal: No chest wall abnormality. No acute or significant osseous findings. Review of the MIP images confirms the above findings. IMPRESSION: 1. Large right and small to moderate left pleural effusion. 2. Adjacent bi basilar areas of consolidation which could be due to compressive atelectasis versus multifocal infectious etiology. 3. Small pericardial effusion. 4. Cardiomegaly. 5. No central, segmental, or subsegmental pulmonary embolism. Electronically Signed   By: Jonna Clark M.D.   On: 10/19/2018 20:11   Dg Chest Portable 1 View  Result Date: 10/19/2018 CLINICAL DATA:  Dyspnea, dry cough EXAM: PORTABLE CHEST 1 VIEW COMPARISON:  10/14/2018 FINDINGS: Moderate right pleural effusion, mildly increased. Small left pleural effusion, grossly unchanged. Bilateral lower lobe opacities, likely atelectasis. No frank interstitial edema.  No pneumothorax. Heart is top-normal in size. IMPRESSION: Moderate right and small left pleural effusions, mildly increased. No frank interstitial edema. Bilateral lower lobe opacities, likely atelectasis. Electronically Signed   By: Charline Bills M.D.   On: 10/19/2018 13:34   US Thoracentesis Asp Pleural Space W/img Guide  Result Date: 10/20/2018 INDICATION: Patient with history of hypertension who presented to the ED yesterday with complaints of new onset dyspnea, dry cough, weakness. CT chest shows a large right and a  small to moderate left pleural effusions. Request IR for diagnostic and therapeutic thoracentesis. EXAM: ULTRASOUND GUIDED RIGHT THORACENTESIS MEDICATIONS: 10 mL 1% lidocaine COMPLICATIONS: None immediate. PROCEDURE: An ultrasound guided thoracentesis was thoroughly discussed with the patient and questions answered. The benefits, risks, alternatives and complications were also discussed. The patient understands and wishes to proceed with the procedure. Written consent was obtained. Ultrasound was performed to localize and mark an adequate pocket of fluid in the right chest. The area was then prepped and draped in the normal sterile fashion. 1% Lidocaine was used for local anesthesia. Under ultrasound guidance a 6 Fr Safe-T-Centesis catheter was introduced. Thoracentesis was performed. The catheter was removed and a dressing applied. FINDINGS: A total of approximately 800 mL of clear yellow fluid was removed. Samples were sent to the laboratory as requested by the clinical team. IMPRESSION: Successful ultrasound guided right thoracentesis yielding 800 mL of pleural fluid. Read by Lynnette Caffey, PA-C Electronically Signed   By: Richarda Overlie M.D.   On: 10/20/2018 11:03       Subjective: Patient doing well, Daughter at bedside plan discharge today All questions answered  Discharge Exam: Vitals:   10/21/18 2108 10/22/18 0513  BP: 123/71 (!) 154/72  Pulse: Marland Kitchen)  102 88  Resp: 16 16  Temp: 98.6 F (37 C) 98 F (36.7 C)  SpO2: 97% 96%   Vitals:   10/21/18 0850 10/21/18 1535 10/21/18 2108 10/22/18 0513  BP: 134/72 128/78 123/71 (!) 154/72  Pulse: 87 72 (!) 102 88  Resp:  18 16 16   Temp: (!) 97.4 F (36.3 C) 98 F (36.7 C) 98.6 F (37 C) 98 F (36.7 C)  TempSrc: Oral Oral  Oral  SpO2:  98% 97% 96%  Weight:      Height:        General: Pt is alert, awake, not in acute distress Cardiovascular: RRR, S1/S2 +, no rubs, no gallops Respiratory: CTA bilaterally, no wheezing, no rhonchi, no  rales, good air movement Abdominal: Soft, NT, ND, bowel sounds + Extremities: no edema, no cyanosis    The results of significant diagnostics from this hospitalization (including imaging, microbiology, ancillary and laboratory) are listed below for reference.     Microbiology: Recent Results (from the past 240 hour(s))  SARS CORONAVIRUS 2 (TAT 6-24 HRS) Nasopharyngeal Nasopharyngeal Swab     Status: None   Collection Time: 10/19/18 12:22 PM   Specimen: Nasopharyngeal Swab  Result Value Ref Range Status   SARS Coronavirus 2 NEGATIVE NEGATIVE Final    Comment: (NOTE) SARS-CoV-2 target nucleic acids are NOT DETECTED. The SARS-CoV-2 RNA is generally detectable in upper and lower respiratory specimens during the acute phase of infection. Negative results do not preclude SARS-CoV-2 infection, do not rule out co-infections with other pathogens, and should not be used as the sole basis for treatment or other patient management decisions. Negative results must be combined with clinical observations, patient history, and epidemiological information. The expected result is Negative. Fact Sheet for Patients: 10/21/18 Fact Sheet for Healthcare Providers: HairSlick.no This test is not yet approved or cleared by the [place] FDA and  has been authorized for detection and/or diagnosis of SARS-CoV-2 by FDA under an Emergency Use Authorization (EUA). This EUA will remain  in effect (meaning this test can be used) for the duration of the COVID-19 declaration under Section 56 4(b)(1) of the Act, 21 U.S.C. section 360bbb-3(b)(1), unless the authorization is terminated or revoked sooner. Performed at [facility], [address] [place], [place]   Culture, blood (routine x 2)     Status: None (Preliminary result)   Collection Time: 10/19/18 11:00 PM   Specimen: BLOOD  Result Value Ref Range Status   Specimen  Description BLOOD RIGHT WRIST  Final   Special Requests   Final    BOTTLES DRAWN AEROBIC AND ANAEROBIC Blood Culture results may not be optimal due to an inadequate volume of blood received in culture bottles   Culture   Final    NO GROWTH 3 DAYS Performed at [facility], [address], [place], [place]    Report Status PENDING  Incomplete  Gram stain     Status: None   Collection Time: 10/20/18 10:42 AM   Specimen: PATH Cytology Pleural fluid  Result Value Ref Range Status   Specimen Description PLEURAL RIGHT  Final   Special Requests NONE  Final   Gram Stain   Final    RARE WBC PRESENT, PREDOMINANTLY MONONUCLEAR NO ORGANISMS SEEN Performed at [facility], [address] [place], [place]    Report Status 10/20/2018 FINAL  Final  Acid Fast Smear (AFB)     Status: None   Collection Time: 10/20/18  10:42 AM   Specimen: PATH Cytology Pleural fluid  Result Value Ref Range Status   AFB Specimen Processing Concentration  Final   Acid Fast Smear Negative  Final    Comment: (NOTE) Performed At: [facility] [place], [place] [id number] Rush Farmer MD FT:[id number]    Source (AFB) PLEURAL  Final    Comment: RIGHT Performed at [facility], [place] [address], [place], [place] [address]   Culture, body fluid-bottle     Status: None (Preliminary result)   Collection Time: 10/20/18 10:42 AM   Specimen: Pleura  Result Value Ref Range Status   Specimen Description PLEURAL RIGHT  Final   Special Requests NONE  Final   Culture   Final    NO GROWTH 2 DAYS Performed at [facility] [address] [place], [place] [address]    Report Status PENDING  Incomplete  Culture, blood (routine x 2)     Status: None (Preliminary result)   Collection Time: 10/20/18 11:35 AM   Specimen: BLOOD  Result Value Ref Range Status   Specimen Description BLOOD SITE NOT SPECIFIED  Final   Special Requests   Final    BOTTLES DRAWN  AEROBIC AND ANAEROBIC Blood Culture adequate volume   Culture   Final    NO GROWTH 2 DAYS Performed at [facility], [address], [place], [place] [address]    Report Status PENDING  Incomplete     Labs: BNP (last 3 results) Recent Labs    10/19/18 1330 10/20/18 1135  BNP 85.4 607.3*   Basic Metabolic Panel: Recent Labs  Lab 10/19/18 1421 10/20/18 1135 10/21/18 0233  NA 128* 132* 132*  K 3.9 3.6 4.4  CL 93* 98 98  CO2 21* 20* 22  GLUCOSE 99 115* 94  BUN 6* 8 8  CREATININE 0.79 0.86 0.87  CALCIUM 9.0 8.7* 8.6*   Liver Function Tests: Recent Labs  Lab 10/19/18 2300  AST 25  ALT 18  ALKPHOS 67  BILITOT 1.0  PROT 6.6  ALBUMIN 2.4*   No results for input(s): LIPASE, AMYLASE in the last 168 hours. No results for input(s): AMMONIA in the last 168 hours. CBC: Recent Labs  Lab 10/19/18 1330 10/20/18 1135  WBC 10.1 11.0*  NEUTROABS 8.4*  --   HGB 14.2 12.9  HCT 42.5 37.7  MCV 84.5 82.9  PLT 398 403*   Cardiac Enzymes: No results for input(s): CKTOTAL, CKMB, CKMBINDEX, TROPONINI in the last 168 hours. BNP: Invalid input(s): POCBNP CBG: No results for input(s): GLUCAP in the last 168 hours. D-Dimer Recent Labs    10/19/18 1625  DDIMER 3.97*   Hgb A1c No results for input(s): HGBA1C in the last 72 hours. Lipid Profile No results for input(s): CHOL, HDL, LDLCALC, TRIG, CHOLHDL, LDLDIRECT in the last 72 hours. Thyroid function studies No results for input(s): TSH, T4TOTAL, T3FREE, THYROIDAB in the last 72 hours.  Invalid input(s): FREET3 Anemia work up No results for input(s): VITAMINB12, FOLATE, FERRITIN, TIBC, IRON, RETICCTPCT in the last 72 hours. Urinalysis    Component Value Date/Time   COLORURINE YELLOW 10/20/2018 1908   APPEARANCEUR CLEAR 10/20/2018 1908   LABSPEC 1.028 10/20/2018 [place] 6.0 10/20/2018 [place] NEGATIVE 10/20/2018 [place] NEGATIVE 10/20/2018 [place] NEGATIVE 10/20/2018 1908    KETONESUR 20 (A) 10/20/2018 1908   PROTEINUR NEGATIVE 10/20/2018 1908   NITRITE NEGATIVE 10/20/2018 1908   LEUKOCYTESUR TRACE (  A) 10/20/2018 1908   Sepsis Labs Invalid input(s): PROCALCITONIN,  WBC,  LACTICIDVEN Microbiology Recent Results (from the past 240 hour(s))  SARS CORONAVIRUS 2 (TAT 6-24 HRS) Nasopharyngeal Nasopharyngeal Swab     Status: None   Collection Time: 10/19/18 12:22 PM   Specimen: Nasopharyngeal Swab  Result Value Ref Range Status   SARS Coronavirus 2 NEGATIVE NEGATIVE Final    Comment: (NOTE) SARS-CoV-2 target nucleic acids are NOT DETECTED. The SARS-CoV-2 RNA is generally detectable in upper and lower respiratory specimens during the acute phase of infection. Negative results do not preclude SARS-CoV-2 infection, do not rule out co-infections with other pathogens, and should not be used as the sole basis for treatment or other patient management decisions. Negative results must be combined with clinical observations, patient history, and epidemiological information. The expected result is Negative. Fact Sheet for Patients: HairSlick.no Fact Sheet for Healthcare Providers: quierodirigir.com This test is not yet approved or cleared by the [place] FDA and  has been authorized for detection and/or diagnosis of SARS-CoV-2 by FDA under an Emergency Use Authorization (EUA). This EUA will remain  in effect (meaning this test can be used) for the duration of the COVID-19 declaration under Section 56 4(b)(1) of the Act, 21 U.S.C. section 360bbb-3(b)(1), unless the authorization is terminated or revoked sooner. Performed at [facility], [address] [place], [place] [address]   Culture, blood (routine x 2)     Status: None (Preliminary result)   Collection Time: 10/19/18 11:00 PM   Specimen: BLOOD  Result Value Ref Range Status   Specimen Description BLOOD RIGHT WRIST  Final   Special Requests    Final    BOTTLES DRAWN AEROBIC AND ANAEROBIC Blood Culture results may not be optimal due to an inadequate volume of blood received in culture bottles   Culture   Final    NO GROWTH 3 DAYS Performed at [facility], [address], [place], [place] [address]    Report Status PENDING  Incomplete  Gram stain     Status: None   Collection Time: 10/20/18 10:42 AM   Specimen: PATH Cytology Pleural fluid  Result Value Ref Range Status   Specimen Description PLEURAL RIGHT  Final   Special Requests NONE  Final   Gram Stain   Final    RARE WBC PRESENT, PREDOMINANTLY MONONUCLEAR NO ORGANISMS SEEN Performed at [facility], [address] [place], [place] [address]    Report Status 10/20/2018 FINAL  Final  Acid Fast Smear (AFB)     Status: None   Collection Time: 10/20/18 10:42 AM   Specimen: PATH Cytology Pleural fluid  Result Value Ref Range Status   AFB Specimen Processing Concentration  Final   Acid Fast Smear Negative  Final    Comment: (NOTE) Performed At: [facility] [address] [place], [place] [id number] Jolene Schimke MD OZ:[id number]    Source (AFB) PLEURAL  Final    Comment: RIGHT Performed at [facility], [address] [place], [place] [address]   Culture, body fluid-bottle     Status: None (Preliminary result)   Collection Time: 10/20/18 10:42 AM   Specimen: Pleura  Result Value Ref Range Status   Specimen Description PLEURAL RIGHT  Final   Special Requests NONE  Final   Culture   Final    NO GROWTH 2 DAYS Performed at [facility], [address] [place], [place] [address]    Report Status  PENDING  Incomplete  Culture, blood (routine x 2)     Status: None (Preliminary result)   Collection Time: 10/20/18 11:35 AM   Specimen: BLOOD  Result Value Ref Range Status   Specimen Description BLOOD SITE NOT SPECIFIED  Final   Special Requests   Final    BOTTLES DRAWN AEROBIC AND ANAEROBIC Blood Culture adequate volume   Culture    Final    NO GROWTH 2 DAYS Performed at [facility], [address] [place], [place] [id number]    Report Status PENDING  Incomplete     Time coordinating discharge: Over 30 minutes  SIGNED:   Burke Keelshristopher Spongberg, MD  [facility] 10/22/2018, 9:33 AM Pager   If 7PM-7AM, please contact night-coverage [email or web address] Password [email or web address]

## 2018-10-22 NOTE — Progress Notes (Signed)
Transport here to get patient for CXR. Patient's daughter states that patient did not sleep well through the night and asking for CXR to be done later. This RN will pass on in RN report.

## 2018-10-22 NOTE — Progress Notes (Signed)
PT Cancellation Note  Patient Details Name: Rebecca Foster MRN: [id number] DOB: 01-06-1934   Cancelled Treatment:    Reason Eval/Treat Not Completed: Patient at procedure or test/unavailable.  Will retry when pt returns as she is available.  DC is in by MD.   Ramond Dial 10/22/2018, 10:35 AM   Mee Hives, PT MS Acute Rehab Dept. Number: [place] and [place]

## 2018-10-22 NOTE — Progress Notes (Signed)
PT Cancellation Note  Patient Details Name: Rebecca Foster MRN: [id number] DOB: 1934/02/12   Cancelled Treatment:    Reason Eval/Treat Not Completed: Other (comment).  Pt is still in system but has discharged.   Ramond Dial 10/22/2018, 1:45 PM   Mee Hives, PT [place] Acute Rehab Dept. Number: [place] and [place]

## 2018-10-23 NOTE — Telephone Encounter (Signed)
Called patient to do a wellness call since her hospital stay and to relay advise about aspirin. Left vm for daughter to call office.

## 2018-10-24 ENCOUNTER — Telehealth: Payer: Self-pay | Admitting: Cardiology

## 2018-10-24 LAB — CULTURE, BLOOD (ROUTINE X 2): Culture: NO GROWTH

## 2018-10-24 NOTE — Telephone Encounter (Signed)
Patient called wanting to know how to access chest xray results from mychart. Information relayed

## 2018-10-24 NOTE — Telephone Encounter (Signed)
Please call regarding chest xray

## 2018-10-25 DIAGNOSIS — Z7982 Long term (current) use of aspirin: Secondary | ICD-10-CM | POA: Diagnosis not present

## 2018-10-25 DIAGNOSIS — E039 Hypothyroidism, unspecified: Secondary | ICD-10-CM | POA: Diagnosis not present

## 2018-10-25 DIAGNOSIS — J90 Pleural effusion, not elsewhere classified: Secondary | ICD-10-CM | POA: Diagnosis not present

## 2018-10-25 DIAGNOSIS — Z9181 History of falling: Secondary | ICD-10-CM | POA: Diagnosis not present

## 2018-10-25 DIAGNOSIS — I5033 Acute on chronic diastolic (congestive) heart failure: Secondary | ICD-10-CM | POA: Diagnosis not present

## 2018-10-25 DIAGNOSIS — I110 Hypertensive heart disease with heart failure: Secondary | ICD-10-CM | POA: Diagnosis not present

## 2018-10-25 LAB — CULTURE, BODY FLUID W GRAM STAIN -BOTTLE: Culture: NO GROWTH

## 2018-10-25 LAB — CULTURE, BLOOD (ROUTINE X 2)
Culture: NO GROWTH
Special Requests: ADEQUATE

## 2018-10-28 ENCOUNTER — Ambulatory Visit (HOSPITAL_BASED_OUTPATIENT_CLINIC_OR_DEPARTMENT_OTHER)
Admission: RE | Admit: 2018-10-28 | Discharge: 2018-10-28 | Disposition: A | Discharge status: Home / Self Care | Payer: Medicare Other | Source: Ambulatory Visit | Attending: Cardiology | Admitting: Cardiology

## 2018-10-29 ENCOUNTER — Ambulatory Visit: Payer: Medicare Other | Admitting: Cardiology

## 2018-10-29 DIAGNOSIS — Z9181 History of falling: Secondary | ICD-10-CM | POA: Diagnosis not present

## 2018-10-29 DIAGNOSIS — I110 Hypertensive heart disease with heart failure: Secondary | ICD-10-CM | POA: Diagnosis not present

## 2018-10-29 DIAGNOSIS — Z7982 Long term (current) use of aspirin: Secondary | ICD-10-CM | POA: Diagnosis not present

## 2018-10-29 DIAGNOSIS — I5033 Acute on chronic diastolic (congestive) heart failure: Secondary | ICD-10-CM | POA: Diagnosis not present

## 2018-10-29 DIAGNOSIS — E039 Hypothyroidism, unspecified: Secondary | ICD-10-CM | POA: Diagnosis not present

## 2018-10-29 DIAGNOSIS — J90 Pleural effusion, not elsewhere classified: Secondary | ICD-10-CM | POA: Diagnosis not present

## 2018-10-31 ENCOUNTER — Ambulatory Visit: Payer: Medicare Other | Admitting: Cardiology

## 2018-11-01 ENCOUNTER — Other Ambulatory Visit: Payer: Self-pay | Admitting: Cardiology

## 2018-11-01 ENCOUNTER — Ambulatory Visit (INDEPENDENT_AMBULATORY_CARE_PROVIDER_SITE_OTHER): Payer: Medicare Other | Admitting: Cardiology

## 2018-11-01 ENCOUNTER — Ambulatory Visit
Admission: RE | Admit: 2018-11-01 | Discharge: 2018-11-01 | Disposition: A | Discharge status: Home / Self Care | Payer: Medicare Other | Source: Ambulatory Visit | Attending: Cardiology | Admitting: Cardiology

## 2018-11-01 ENCOUNTER — Other Ambulatory Visit: Payer: Self-pay

## 2018-11-01 ENCOUNTER — Encounter: Payer: Self-pay | Admitting: Cardiology

## 2018-11-01 VITALS — BP 127/77 | HR 96 | Ht 60.0 in | Wt 100.0 lb

## 2018-11-01 DIAGNOSIS — J90 Pleural effusion, not elsewhere classified: Secondary | ICD-10-CM

## 2018-11-01 DIAGNOSIS — R7989 Other specified abnormal findings of blood chemistry: Secondary | ICD-10-CM | POA: Diagnosis not present

## 2018-11-01 DIAGNOSIS — I10 Essential (primary) hypertension: Secondary | ICD-10-CM

## 2018-11-01 DIAGNOSIS — R197 Diarrhea, unspecified: Secondary | ICD-10-CM

## 2018-11-01 DIAGNOSIS — E871 Hypo-osmolality and hyponatremia: Secondary | ICD-10-CM | POA: Diagnosis not present

## 2018-11-01 DIAGNOSIS — R9431 Abnormal electrocardiogram [ECG] [EKG]: Secondary | ICD-10-CM | POA: Diagnosis not present

## 2018-11-01 DIAGNOSIS — I5033 Acute on chronic diastolic (congestive) heart failure: Secondary | ICD-10-CM | POA: Diagnosis not present

## 2018-11-01 MED ORDER — TORSEMIDE 20 MG PO TABS: 20.0000 mg | ORAL_TABLET | Freq: Two times a day (BID) | ORAL | 1 refills | Status: DC

## 2018-11-01 MED ORDER — POTASSIUM CHLORIDE ER 10 MEQ PO TBCR: 10.0000 meq | EXTENDED_RELEASE_TABLET | Freq: Two times a day (BID) | ORAL | 0 refills | Status: DC | PRN

## 2018-11-01 MED ORDER — SPIRONOLACTONE 25 MG PO TABS: 25.0000 mg | ORAL_TABLET | Freq: Every day | ORAL | 2 refills | Status: DC

## 2018-11-01 NOTE — Progress Notes (Signed)
Primary Physician/Referring:  Lorenda Ishihara, MD  [name], female    DOB: 1934/08/30, 84 y.o.   MRN: [id number]  Chief Complaint  Patient presents with  . Shortness of Breath  . no appetite   HPI:    Rebecca Foster  is a 84 y.o. Asian [place] female with mild hypertension, who had been complaining of gradually worsening fatigue, has also noticed worsening dyspnea, was seen in the emergency room on 10/19/2018 and discharged on 10/22/2018 after she underwent pleurocentesis of the right pleural effusion with aspiration of a 10 mL of straw-colored fluid, which was negative for exudative fluid or infection. She noticed marked improvement in dyspnea. She is now establishing care with me.  Past Medical History:  Diagnosis Date  . Hypertension    Past Surgical History:  Procedure Laterality Date  . lung tap    . NO PAST SURGERIES    . [place] PLEURAL EFFUSION BILATERAL ([facility] HX)  10/19/2018   Social History   Socioeconomic History  . Marital status: Widowed    Spouse name: Not on file  . Number of children: 3  . Years of education: 22  . Highest education level: Not on file  Occupational History  . Not on file  Social Needs  . Financial resource strain: Not on file  . Food insecurity    Worry: Not on file    Inability: Not on file  . Transportation needs    Medical: Not on file    Non-medical: Not on file  Tobacco Use  . Smoking status: Never Smoker  . Smokeless tobacco: Never Used  Substance and Sexual Activity  . Alcohol use: No  . Drug use: No  . Sexual activity: Not on file  Lifestyle  . Physical activity    Days per week: Not on file    Minutes per session: Not on file  . Stress: Not on file  Relationships  . Social Musician on phone: Not on file    Gets together: Not on file    Attends religious service: Not on file    Active member of club or organization: Not on file    Attends meetings of clubs or organizations: Not on file     Relationship status: Not on file  . Intimate partner violence    Fear of current or ex partner: Not on file    Emotionally abused: Not on file    Physically abused: Not on file    Forced sexual activity: Not on file  Other Topics Concern  . Not on file  Social History Narrative   Lives alone in a one story home.  Has 3 daughters.  Education: high school.   ROS  Review of Systems  Constitution: Positive for malaise/fatigue. Negative for chills, decreased appetite and weight gain.  Cardiovascular: Positive for dyspnea on exertion and orthopnea. Negative for leg swelling and syncope.  Endocrine: Negative for cold intolerance.  Hematologic/Lymphatic: Does not bruise/bleed easily.  Musculoskeletal: Negative for joint swelling.  Gastrointestinal: Positive for anorexia and diarrhea (2 days). Negative for abdominal pain, change in bowel habit, hematochezia and melena.  Neurological: Negative for headaches and light-headedness.  Psychiatric/Behavioral: Negative for depression and substance abuse.  All other systems reviewed and are negative.  Objective   Vitals with BMI 11/01/2018 10/22/2018 10/21/2018  Height 5\' 0"  - -  Weight 100 lbs - -  BMI 19.53 - -  Systolic 127 154  Diastolic 77 72 71  Pulse 96 88 102    Blood pressure 127/77, pulse 96, height 5' (1.524 m), weight 100 lb (45.4 kg), SpO2 96 %. Body mass index is 19.53 kg/m.   Physical Exam  Constitutional:  She is moderately built  And petite in no acute distress  HENT:  Head: Atraumatic.  Eyes: Conjunctivae are normal.  Neck: Neck supple. No thyromegaly present.  Cardiovascular: Normal rate, regular rhythm, normal heart sounds and intact distal pulses. Exam reveals no gallop.  No murmur heard. No leg edema, no JVD.  Pulmonary/Chest: Effort normal. She has decreased breath sounds (right base 1/2 way up and 1/4 th way up  left base. Occasional scattered crackles).  Abdominal: Soft. Bowel sounds are normal.   Musculoskeletal: Normal range of motion.  Neurological: She is alert.  Skin: Skin is warm and dry.  Psychiatric: She has a normal mood and affect.   Laboratory examination:   Recent Labs    10/19/18 1421 10/20/18 1135 10/21/18 0233  NA 128* 132* 132*  K 3.9 3.6 4.4  CL 93* 98 98  CO2 21* 20* 22  GLUCOSE 99 115* 94  BUN 6* 8 8  CREATININE 0.79 0.86 0.87  CALCIUM 9.0 8.7* 8.6*  GFRNONAA >60 >60 >60  GFRAA >60 >60 >60   CMP Latest Ref Rng & Units 10/21/2018 10/20/2018 10/19/2018  Glucose 70 - 99 mg/dL 94 161(W115(H) 99  BUN 8 - 23 mg/dL 8 8 6(L)  Creatinine 9.600.44 - 1.00 mg/dL 4.540.87 0.980.86 1.190.79  Sodium 135 - 145 mmol/L 132(L) 132(L) 128(L)  Potassium 3.5 - 5.1 mmol/L 4.4 3.6 3.9  Chloride 98 - 111 mmol/L 98 98 93(L)  CO2 22 - 32 mmol/L 22 20(L) 21(L)  Calcium 8.9 - 10.3 mg/dL 1.4(N8.6(L) 8.2(N8.7(L) 9.0  Total Protein 6.5 - 8.1 g/dL - - 6.6  Total Bilirubin 0.3 - 1.2 mg/dL - - 1.0  Alkaline Phos 38 - 126 U/L - - 67  AST 15 - 41 U/L - - 25  ALT 0 - 44 U/L - - 18   CBC Latest Ref Rng & Units 10/20/2018 10/19/2018 03/22/2016  WBC 4.0 - 10.5 K/uL 11.0(H) 10.1 7.3  Hemoglobin 12.0 - 15.0 g/dL 56.212.9 13.014.2 86.513.5  Hematocrit 36.0 - 46.0 % 37.7 42.5 41.3  Platelets 150 - 400 K/uL 403(H) 398 209   BNP    Component Value Date/Time   BNP 141.2 (H) 11/01/2018 1357   BNP 156.5 (H) 10/20/2018 1135   ProBNP No results found for: PROBNP  Lipid Panel  No results found for: CHOL, TRIG, HDL, CHOLHDL, VLDL, LDLCALC, LDLDIRECT HEMOGLOBIN A1C No results found for: HGBA1C, MPG TSH Recent Labs    11/01/18 1356  TSH 4.510*   Medications and allergies   Allergies  Allergen Reactions  . Amoxicillin Other (See Comments)    Extreme fatigue Did it involve swelling of the face/tongue/throat, SOB, or low BP? No Did it involve sudden or severe rash/hives, skin peeling, or any reaction on the inside of your mouth or nose? No Did you need to seek medical attention at a hospital or doctor's office? No When  did it last happen?[age] ish years old If all above answers are "NO", may proceed with cephalosporin use.  . Codeine Other (See Comments)    Higher dose - sensitive, burning sensation      Prior to Admission medications   Medication Sig Start Date End Date Taking? Authorizing Provider  furosemide (LASIX) 20 MG tablet Take 1 tablet (20 mg total) by mouth daily.  10/22/18 11/21/18 Yes Spongberg, Susy Frizzle, MD  levothyroxine (SYNTHROID) 25 MCG tablet Take 25 mcg by mouth daily before breakfast.  08/24/18  Yes [provider]  Melatonin 3 MG TABS Take 1.5 mg by mouth at bedtime as needed (sleep).   Yes [provider]  metoprolol succinate (TOPROL-XL) 50 MG 24 hr tablet Take 1 tablet (50 mg total) by mouth 2 (two) times daily. Take with or immediately following a meal. Patient taking differently: Take 50 mg by mouth daily.  10/15/18  Yes Revankar, Aundra Dubin, MD  potassium chloride (KLOR-CON) 10 MEQ tablet Take 1 tablet (10 mEq total) by mouth daily. 10/22/18 11/21/18 Yes Spongberg, Susy Frizzle, MD  aspirin EC 81 MG tablet Take 81 mg by mouth daily.    [provider]     Current Outpatient Medications  Medication Instructions  . aspirin EC 81 mg, Oral, Daily  . levothyroxine (SYNTHROID) 25 mcg, Oral, Daily before breakfast  . Melatonin 1.5 mg, Oral, At bedtime PRN  . metoprolol succinate (TOPROL-XL) 50 mg, Oral, 2 times daily, Take with or immediately following a meal.   . potassium chloride (KLOR-CON) 10 MEQ tablet 10 mEq, Oral, 2 times daily PRN  . spironolactone (ALDACTONE) 25 mg, Oral, Daily  . torsemide (DEMADEX) 20 mg, Oral, 2 times daily, For 3 days then one tablet daily    Radiology:   CXR PA/LAT 11/01/2018:  Probable underlying COPD with bibasilar pleural effusions and atelectasis, increased on RIGHT and slightly decreased on LEFT since prior exam.  Cardiac Studies:   Echocardiogram 10/20/2018:  1. Left ventricular ejection fraction, by visual  estimation, is 65 to 70%. The left ventricle has hyperdynamic function. Decreased left ventricular size. There is mildly increased left ventricular hypertrophy.  2. Left ventricular diastolic Doppler parameters are consistent with impaired relaxation pattern of LV diastolic filling. 3. 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 Trace mitral valve regurgitation. 4. The tricuspid valve is grossly normal. Tricuspid valve regurgitation is mild.  The tricuspid regurgitant velocity is 3.34 m/s, and with an assumed right atrial pressure of 3 mmHg, the estimated right ventricular systolic pressure is moderately elevated at 47.6 mmHg. 5. Small pericardial effusion with signs of organization. The pericardial effusion is predominantly anterior to the right ventricle and lateral to the left ventricle, also trivial amount posteriorly. Mitral outflow pattern does not suggest hemodynamic significance.  Left pleural effusion is noted.  Assessment     ICD-10-CM   1. Acute on chronic diastolic heart failure (HCC)  Z61.09 EKG 12-Lead    Brain natriuretic peptide    COMPLETE METABOLIC PANEL WITH GFR    spironolactone (ALDACTONE) 25 MG tablet    torsemide (DEMADEX) 20 MG tablet    Basic metabolic panel    Brain natriuretic peptide    TSH      2. Pleural effusion, bilateral  J90 EKG 12-Lead    DG Chest 2 View  3. Hyponatremia  E87.1   4. Essential hypertension  I10   5. Nonspecific abnormal electrocardiogram (ECG) (EKG)  R94.31   6. Diarrhea, unspecified type  R19.7 Stool C-Diff Toxin Assay  7. Abnormal TSH  R79.89     EKG 11/01/2018: Normal sinus rhythm at rate of 98 bpm, normal axis, poor R-wave progression, cannot exclude anteroseptal infarct old.  Diffuse nonspecific T abnormality.  No significant change from  EKG 10/19/2018  Recommendations:   Patient here to establish care, discharged from the hospital on 10/22/2018 after being treated for bilateral pleural effusion  and acute  diastolic heart failure.  She has reaccumulated fluid in both the lungs.  Half the right lung and left base.  She is now developed significant orthopnea as well but is not in florid decompensated heart failure that needs hospitalization.  I will discontinue furosemide and switched him to torsemide one p.o. b.i.d. for 3 days then one pill once a day.  I will also start her on spironolactone 25 mg daily.  She will continue with potassium supplements as well, I'll check labs today and again in one week and I would like to see her back in 10 days.  She'll also obtained a chest x-ray. I have discussed hyponatremia.   Over the past 3 days she's been having diarrhea, I'll obtain stool C. diff her daughter is present, I have explained to them that she will continue to feel fatigue and dyspnea on exertion and orthopnea and to sleep propped up. Her CXR from today confirms recurrence of CHF and pleural effusion.  TSH is mildly abnormal and will continue to observe for now.   This was a 60 minute OV encounter >50% time spent face to face in evaluation of her complex medical presentation, discussion regarding her hospital procedures and therapy evaluation.  Adrian Prows, MD, [facility] 11/02/2018, 1:52 PM [facility] [place] Pager: [phone] Office: [phone] If no answer Cell [phone]

## 2018-11-01 NOTE — Patient Instructions (Addendum)
Stop taking furosemide.  You are given a new prescription called torsemide, he will take one tablet twice a day over this weekend that his for 3 days.  After that we'll switch to taking it month or once a day.  You'll continue to take potassium supplements, twice daily with torsemide for the next 3 days and then he will take one pill once a day with torsemide.  Get labs today and also chest x-ray today.  I will like to see her back in 10 days.  You will also get labs in 1 week prior to seeing me again in 10 days.

## 2018-11-02 DIAGNOSIS — Z7982 Long term (current) use of aspirin: Secondary | ICD-10-CM | POA: Diagnosis not present

## 2018-11-02 DIAGNOSIS — E039 Hypothyroidism, unspecified: Secondary | ICD-10-CM | POA: Diagnosis not present

## 2018-11-02 DIAGNOSIS — I5033 Acute on chronic diastolic (congestive) heart failure: Secondary | ICD-10-CM | POA: Diagnosis not present

## 2018-11-02 DIAGNOSIS — Z9181 History of falling: Secondary | ICD-10-CM | POA: Diagnosis not present

## 2018-11-02 DIAGNOSIS — J90 Pleural effusion, not elsewhere classified: Secondary | ICD-10-CM | POA: Diagnosis not present

## 2018-11-02 DIAGNOSIS — I110 Hypertensive heart disease with heart failure: Secondary | ICD-10-CM | POA: Diagnosis not present

## 2018-11-02 LAB — BRAIN NATRIURETIC PEPTIDE: BNP: 141.2 pg/mL — ABNORMAL HIGH (ref 0.0–100.0)

## 2018-11-02 LAB — TSH: TSH: 4.51 u[IU]/mL — ABNORMAL HIGH (ref 0.450–4.500)

## 2018-11-04 DIAGNOSIS — I10 Essential (primary) hypertension: Secondary | ICD-10-CM | POA: Diagnosis not present

## 2018-11-04 DIAGNOSIS — J90 Pleural effusion, not elsewhere classified: Secondary | ICD-10-CM | POA: Diagnosis not present

## 2018-11-04 DIAGNOSIS — I5031 Acute diastolic (congestive) heart failure: Secondary | ICD-10-CM | POA: Diagnosis not present

## 2018-11-04 DIAGNOSIS — R197 Diarrhea, unspecified: Secondary | ICD-10-CM | POA: Diagnosis not present

## 2018-11-05 DIAGNOSIS — R197 Diarrhea, unspecified: Secondary | ICD-10-CM | POA: Diagnosis not present

## 2018-11-05 DIAGNOSIS — Z7982 Long term (current) use of aspirin: Secondary | ICD-10-CM | POA: Diagnosis not present

## 2018-11-05 DIAGNOSIS — E039 Hypothyroidism, unspecified: Secondary | ICD-10-CM | POA: Diagnosis not present

## 2018-11-05 DIAGNOSIS — J90 Pleural effusion, not elsewhere classified: Secondary | ICD-10-CM | POA: Diagnosis not present

## 2018-11-05 DIAGNOSIS — I110 Hypertensive heart disease with heart failure: Secondary | ICD-10-CM | POA: Diagnosis not present

## 2018-11-05 DIAGNOSIS — I5033 Acute on chronic diastolic (congestive) heart failure: Secondary | ICD-10-CM | POA: Diagnosis not present

## 2018-11-05 DIAGNOSIS — Z9181 History of falling: Secondary | ICD-10-CM | POA: Diagnosis not present

## 2018-11-06 DIAGNOSIS — Z9181 History of falling: Secondary | ICD-10-CM | POA: Diagnosis not present

## 2018-11-06 DIAGNOSIS — J90 Pleural effusion, not elsewhere classified: Secondary | ICD-10-CM | POA: Diagnosis not present

## 2018-11-06 DIAGNOSIS — I5033 Acute on chronic diastolic (congestive) heart failure: Secondary | ICD-10-CM | POA: Diagnosis not present

## 2018-11-06 DIAGNOSIS — I110 Hypertensive heart disease with heart failure: Secondary | ICD-10-CM | POA: Diagnosis not present

## 2018-11-06 DIAGNOSIS — E039 Hypothyroidism, unspecified: Secondary | ICD-10-CM | POA: Diagnosis not present

## 2018-11-06 DIAGNOSIS — Z7982 Long term (current) use of aspirin: Secondary | ICD-10-CM | POA: Diagnosis not present

## 2018-11-06 LAB — CLOSTRIDIUM DIFFICILE EIA: C difficile Toxins A+B, EIA: NEGATIVE

## 2018-11-07 ENCOUNTER — Other Ambulatory Visit: Payer: Self-pay | Admitting: Cardiology

## 2018-11-07 DIAGNOSIS — I5033 Acute on chronic diastolic (congestive) heart failure: Secondary | ICD-10-CM

## 2018-11-07 DIAGNOSIS — Z9181 History of falling: Secondary | ICD-10-CM | POA: Diagnosis not present

## 2018-11-07 DIAGNOSIS — J90 Pleural effusion, not elsewhere classified: Secondary | ICD-10-CM | POA: Diagnosis not present

## 2018-11-07 DIAGNOSIS — E871 Hypo-osmolality and hyponatremia: Secondary | ICD-10-CM

## 2018-11-07 DIAGNOSIS — I110 Hypertensive heart disease with heart failure: Secondary | ICD-10-CM | POA: Diagnosis not present

## 2018-11-07 DIAGNOSIS — Z7982 Long term (current) use of aspirin: Secondary | ICD-10-CM | POA: Diagnosis not present

## 2018-11-07 DIAGNOSIS — E039 Hypothyroidism, unspecified: Secondary | ICD-10-CM | POA: Diagnosis not present

## 2018-11-08 DIAGNOSIS — I110 Hypertensive heart disease with heart failure: Secondary | ICD-10-CM | POA: Diagnosis not present

## 2018-11-08 DIAGNOSIS — I5033 Acute on chronic diastolic (congestive) heart failure: Secondary | ICD-10-CM | POA: Diagnosis not present

## 2018-11-08 DIAGNOSIS — E039 Hypothyroidism, unspecified: Secondary | ICD-10-CM | POA: Diagnosis not present

## 2018-11-08 DIAGNOSIS — Z9181 History of falling: Secondary | ICD-10-CM | POA: Diagnosis not present

## 2018-11-08 DIAGNOSIS — Z7982 Long term (current) use of aspirin: Secondary | ICD-10-CM | POA: Diagnosis not present

## 2018-11-08 DIAGNOSIS — J90 Pleural effusion, not elsewhere classified: Secondary | ICD-10-CM | POA: Diagnosis not present

## 2018-11-11 DIAGNOSIS — I5033 Acute on chronic diastolic (congestive) heart failure: Secondary | ICD-10-CM | POA: Diagnosis not present

## 2018-11-11 DIAGNOSIS — Z7982 Long term (current) use of aspirin: Secondary | ICD-10-CM | POA: Diagnosis not present

## 2018-11-11 DIAGNOSIS — J90 Pleural effusion, not elsewhere classified: Secondary | ICD-10-CM | POA: Diagnosis not present

## 2018-11-11 DIAGNOSIS — I110 Hypertensive heart disease with heart failure: Secondary | ICD-10-CM | POA: Diagnosis not present

## 2018-11-11 DIAGNOSIS — Z9181 History of falling: Secondary | ICD-10-CM | POA: Diagnosis not present

## 2018-11-11 DIAGNOSIS — E039 Hypothyroidism, unspecified: Secondary | ICD-10-CM | POA: Diagnosis not present

## 2018-11-12 DIAGNOSIS — Z9181 History of falling: Secondary | ICD-10-CM | POA: Diagnosis not present

## 2018-11-12 DIAGNOSIS — E039 Hypothyroidism, unspecified: Secondary | ICD-10-CM | POA: Diagnosis not present

## 2018-11-12 DIAGNOSIS — J90 Pleural effusion, not elsewhere classified: Secondary | ICD-10-CM | POA: Diagnosis not present

## 2018-11-12 DIAGNOSIS — I5033 Acute on chronic diastolic (congestive) heart failure: Secondary | ICD-10-CM | POA: Diagnosis not present

## 2018-11-12 DIAGNOSIS — Z7982 Long term (current) use of aspirin: Secondary | ICD-10-CM | POA: Diagnosis not present

## 2018-11-12 DIAGNOSIS — I110 Hypertensive heart disease with heart failure: Secondary | ICD-10-CM | POA: Diagnosis not present

## 2018-11-13 ENCOUNTER — Other Ambulatory Visit: Payer: Self-pay

## 2018-11-13 ENCOUNTER — Ambulatory Visit (INDEPENDENT_AMBULATORY_CARE_PROVIDER_SITE_OTHER): Payer: Medicare Other | Admitting: Cardiology

## 2018-11-13 ENCOUNTER — Encounter: Payer: Self-pay | Admitting: Cardiology

## 2018-11-13 VITALS — BP 146/76 | HR 78 | Temp 97.9°F | Ht 60.0 in | Wt 98.3 lb

## 2018-11-13 DIAGNOSIS — E871 Hypo-osmolality and hyponatremia: Secondary | ICD-10-CM

## 2018-11-13 DIAGNOSIS — R197 Diarrhea, unspecified: Secondary | ICD-10-CM | POA: Diagnosis not present

## 2018-11-13 DIAGNOSIS — I110 Hypertensive heart disease with heart failure: Secondary | ICD-10-CM | POA: Diagnosis not present

## 2018-11-13 DIAGNOSIS — E039 Hypothyroidism, unspecified: Secondary | ICD-10-CM | POA: Diagnosis not present

## 2018-11-13 DIAGNOSIS — J90 Pleural effusion, not elsewhere classified: Secondary | ICD-10-CM

## 2018-11-13 DIAGNOSIS — I5032 Chronic diastolic (congestive) heart failure: Secondary | ICD-10-CM

## 2018-11-13 DIAGNOSIS — N179 Acute kidney failure, unspecified: Secondary | ICD-10-CM

## 2018-11-13 DIAGNOSIS — Z7982 Long term (current) use of aspirin: Secondary | ICD-10-CM | POA: Diagnosis not present

## 2018-11-13 DIAGNOSIS — I5033 Acute on chronic diastolic (congestive) heart failure: Secondary | ICD-10-CM | POA: Diagnosis not present

## 2018-11-13 DIAGNOSIS — Z9181 History of falling: Secondary | ICD-10-CM | POA: Diagnosis not present

## 2018-11-13 LAB — BASIC METABOLIC PANEL
BUN/Creatinine Ratio: 6 — ABNORMAL LOW (ref 12–28)
BUN: 7 mg/dL — ABNORMAL LOW (ref 8–27)
CO2: 24 mmol/L (ref 20–29)
Calcium: 9.7 mg/dL (ref 8.7–10.3)
Chloride: 96 mmol/L (ref 96–106)
Creatinine, Ser: 1.1 mg/dL — ABNORMAL HIGH (ref 0.57–1.00)
GFR calc Af Amer: 54 mL/min/{1.73_m2} — ABNORMAL LOW (ref >59)
GFR calc non Af Amer: 47 mL/min/{1.73_m2} — ABNORMAL LOW (ref >59)
Glucose: 90 mg/dL (ref 65–99)
Potassium: 5.4 mmol/L — ABNORMAL HIGH (ref 3.5–5.2)
Sodium: 133 mmol/L — ABNORMAL LOW (ref 134–144)

## 2018-11-13 LAB — BRAIN NATRIURETIC PEPTIDE: BNP: 110.4 pg/mL — ABNORMAL HIGH (ref 0.0–100.0)

## 2018-11-13 MED ORDER — SPIRONOLACTONE 25 MG PO TABS: 25.0000 mg | ORAL_TABLET | Freq: Every day | ORAL | 2 refills | Status: DC

## 2018-11-13 NOTE — Progress Notes (Signed)
Primary Physician/Referring:  Lorenda IshiharaVaradarajan, Rupashree, MD  Patient ID: Rebecca FishmanSudhaben Foster, female    DOB: 10/02/1934, 84 y.o.   MRN: [id number]  Chief Complaint  Patient presents with  . Congestive Heart Failure  . Follow-up   HPI:    Rebecca Foster  is a 84 y.o. Asian [place] female with mild hypertension, who had been complaining of gradually worsening fatigue, has also noticed worsening dyspnea, was seen in the emergency room on 10/19/2018 and discharged on 10/22/2018 after she underwent pleurocentesis of the right pleural effusion with aspiration of a 10 mL of straw-colored fluid, which was negative for exudative fluid or infection.   Patient was seen by me 2 weeks ago for worsening dyspnea, orthopnea, PND and suspected acute on chronic diastolic heart failure and also pleural effusion contributing to this. She took Torsemide for 3 days and now on Aldactone alone and torsemide discontinued due to severe hyponatremia with Na dropping to 125.   Patient has improved appetite,  No further PND or orthopnea and states she has started to exercise.   Past Medical History:  Diagnosis Date  . Hypertension    Past Surgical History:  Procedure Laterality Date  . lung tap    . NO PAST SURGERIES    . [place] PLEURAL EFFUSION BILATERAL ([facility] HX)  10/19/2018   Social History   Socioeconomic History  . Marital status: Widowed    Spouse name: Not on file  . Number of children: 3  . Years of education: 1112  . Highest education level: Not on file  Occupational History  . Not on file  Social Needs  . Financial resource strain: Not on file  . Food insecurity    Worry: Not on file    Inability: Not on file  . Transportation needs    Medical: Not on file    Non-medical: Not on file  Tobacco Use  . Smoking status: Never Smoker  . Smokeless tobacco: Never Used  Substance and Sexual Activity  . Alcohol use: No  . Drug use: No  . Sexual activity: Not on file  Lifestyle  . Physical activity    Days  per week: Not on file    Minutes per session: Not on file  . Stress: Not on file  Relationships  . Social Musicianconnections    Talks on phone: Not on file    Gets together: Not on file    Attends religious service: Not on file    Active member of club or organization: Not on file    Attends meetings of clubs or organizations: Not on file    Relationship status: Not on file  . Intimate partner violence    Fear of current or ex partner: Not on file    Emotionally abused: Not on file    Physically abused: Not on file    Forced sexual activity: Not on file  Other Topics Concern  . Not on file  Social History Narrative   Lives alone in a one story home.  Has 3 daughters.  Education: high school.   ROS  Review of Systems  Constitution: Negative for chills, decreased appetite, malaise/fatigue and weight gain.  Cardiovascular: Positive for dyspnea on exertion (improved). Negative for leg swelling, orthopnea and syncope.  Endocrine: Negative for cold intolerance.  Hematologic/Lymphatic: Does not bruise/bleed easily.  Musculoskeletal: Negative for joint swelling.  Gastrointestinal: Positive for anorexia and diarrhea (2 days). Negative for abdominal pain, change in bowel habit, hematochezia and melena.  Neurological: Negative for  headaches and light-headedness.  Psychiatric/Behavioral: Negative for depression and substance abuse.  All other systems reviewed and are negative.  Objective   Vitals with BMI 11/13/2018 11/01/2018 10/22/2018  Height   -  Weight 98 lbs 5 oz 100 lbs -  BMI 19.2 19.53 -  Systolic 146 127 161  Diastolic 76 77 72  Pulse 78 96 88    Blood pressure (!) 146/76, pulse 78, temperature 97.9 F (36.6 C), height 5' (1.524 m), weight 98 lb 4.8 oz (44.6 kg), SpO2 96 %. Body mass index is 19.2 kg/m.   Physical Exam  Constitutional:  She is moderately built  And petite in no acute distress  HENT:  Head: Atraumatic.  Eyes: Conjunctivae are normal.  Neck: Neck  supple. No thyromegaly present.  Cardiovascular: Normal rate, regular rhythm, normal heart sounds and intact distal pulses. Exam reveals no gallop.  No murmur heard. No leg edema, no JVD.  Pulmonary/Chest: Effort normal. She has decreased breath sounds (clear left lung and decreased breath sounds left 1/3 base).  Abdominal: Soft. Bowel sounds are normal.  Musculoskeletal: Normal range of motion.  Neurological: She is alert.  Skin: Skin is warm and dry.  Psychiatric: She has a normal mood and affect.   Laboratory examination:   Recent Labs    10/20/18 1135 10/21/18 0233 11/12/18 1153  NA 132* 132* 133*  K 3.6 4.4 5.4*  CL 98 98 96  CO2 20* 22 24  GLUCOSE 115* 94 90  BUN 8 8 7*  CREATININE 0.86 0.87 1.10*  CALCIUM 8.7* 8.6* 9.7  GFRNONAA >60 >60 47*  GFRAA >60 >60 54*   CMP Latest Ref Rng & Units 11/12/2018 10/21/2018 10/20/2018  Glucose 65 - 99 mg/dL 90 94 096(E)  BUN 8 - 27 mg/dL 7(L) 8 8  Creatinine 4.54 - 1.00 mg/dL 0.98(J) 1.91 4.78  Sodium 134 - 144 mmol/L 133(L) 132(L) 132(L)  Potassium 3.5 - 5.2 mmol/L 5.4(H) 4.4 3.6  Chloride 96 - 106 mmol/L 96 98 98  CO2 20 - 29 mmol/L 24 22 20(L)  Calcium 8.7 - 10.3 mg/dL 9.7 2.9(F) 6.2(Z)  Total Protein 6.5 - 8.1 g/dL - - -  Total Bilirubin 0.3 - 1.2 mg/dL - - -  Alkaline Phos 38 - 126 U/L - - -  AST 15 - 41 U/L - - -  ALT 0 - 44 U/L - - -   CBC Latest Ref Rng & Units 10/20/2018 10/19/2018 03/22/2016  WBC 4.0 - 10.5 K/uL 11.0(H) 10.1 7.3  Hemoglobin 12.0 - 15.0 g/dL 30.8 65.7 84.6  Hematocrit 36.0 - 46.0 % 37.7 42.5 41.3  Platelets 150 - 400 K/uL 403(H) 398 209   BNP    Component Value Date/Time   BNP 110.4 (H) 11/12/2018 1154   BNP 156.5 (H) 10/20/2018 1135   ProBNP No results found for: PROBNP  Lipid Panel  No results found for: CHOL, TRIG, HDL, CHOLHDL, VLDL, LDLCALC, LDLDIRECT HEMOGLOBIN A1C No results found for: HGBA1C, MPG TSH Recent Labs    11/01/18 1356  TSH 4.510*   Medications and allergies    Allergies  Allergen Reactions  . Amoxicillin Other (See Comments)    Extreme fatigue Did it involve swelling of the face/tongue/throat, SOB, or low BP? No Did it involve sudden or severe rash/hives, skin peeling, or any reaction on the inside of your mouth or nose? No Did you need to seek medical attention at a hospital or doctor's office? No When did it last happen?72 ish  years old If all above answers are "NO", may proceed with cephalosporin use.  . Codeine Other (See Comments)    Higher dose - sensitive, burning sensation      Prior to Admission medications   Medication Sig Start Date End Date Taking? Authorizing Provider  furosemide (LASIX) 20 MG tablet Take 1 tablet (20 mg total) by mouth daily. 10/22/18 11/21/18 Yes Spongberg, Audie Pinto, MD  levothyroxine (SYNTHROID) 25 MCG tablet Take 25 mcg by mouth daily before breakfast.  08/24/18  Yes [provider]  Melatonin 3 MG TABS Take 1.5 mg by mouth at bedtime as needed (sleep).   Yes [provider]  metoprolol succinate (TOPROL-XL) 50 MG 24 hr tablet Take 1 tablet (50 mg total) by mouth 2 (two) times daily. Take with or immediately following a meal. Patient taking differently: Take 50 mg by mouth daily.  10/15/18  Yes Revankar, Reita Cliche, MD  potassium chloride (KLOR-CON) 10 MEQ tablet Take 1 tablet (10 mEq total) by mouth daily. 10/22/18 11/21/18 Yes Spongberg, Audie Pinto, MD  aspirin EC 81 MG tablet Take 81 mg by mouth daily.    [provider]     Current Outpatient Medications  Medication Instructions  . aspirin EC 81 mg, Oral, Daily  . levothyroxine (SYNTHROID) 25 mcg, Oral, Daily before breakfast  . loperamide (IMODIUM) 2 MG capsule Oral, As needed  . Melatonin 1.5 mg, Oral, At bedtime PRN  . metoprolol succinate (TOPROL-XL) 50 mg, Oral, Daily, Take with or immediately following a meal.   . spironolactone (ALDACTONE) 25 mg, Oral, Daily    Radiology:   CXR PA/LAT 11/01/2018:   Probable underlying COPD with bibasilar pleural effusions and atelectasis, increased on RIGHT and slightly decreased on LEFT since prior exam.  Cardiac Studies:   Echocardiogram 10/20/2018:  1. Left ventricular ejection fraction, by visual estimation, is 65 to 70%. The left ventricle has hyperdynamic function. Decreased left ventricular size. There is mildly increased left ventricular hypertrophy.  2. Left ventricular diastolic Doppler parameters are consistent with impaired relaxation pattern of LV diastolic filling. 3. 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 Trace mitral valve regurgitation. 4. The tricuspid valve is grossly normal. Tricuspid valve regurgitation is mild.  The tricuspid regurgitant velocity is 3.34 m/s, and with an assumed right atrial pressure of 3 mmHg, the estimated right ventricular systolic pressure is moderately elevated at 47.6 mmHg. 5. Small pericardial effusion with signs of organization. The pericardial effusion is predominantly anterior to the right ventricle and lateral to the left ventricle, also trivial amount posteriorly. Mitral outflow pattern does not suggest hemodynamic significance.  Left pleural effusion is noted.  Assessment     ICD-10-CM   1. Hyponatremia  H08.6 Basic Metabolic Panel (BMET)  2. Pleural effusion, bilateral  J90 spironolactone (ALDACTONE) 25 MG tablet  3. Chronic diastolic heart failure (HCC)  I50.32   4. Diarrhea, unspecified type  R19.7   5. Acute renal failure, unspecified acute renal failure type ([place])  N17.9       EKG 11/01/2018: Normal sinus rhythm at rate of 98 bpm, normal axis, poor R-wave progression, cannot exclude anteroseptal infarct old.  Diffuse nonspecific T abnormality.  No significant change from  EKG 10/19/2018  Recommendations:   Patient was seen by me 2 weeks ago for worsening dyspnea, orthopnea, PND and suspected acute on chronic diastolic heart failure and also pleural effusion contributing to  this.  Since addition of spironolactone, she also took torsemide for a few days, symptoms of  dyspnea has improved remarkably.  Today on exam she has still persistent right pleural effusion with much improved and left leg is completely well expanded.  She probably has very mild diastolic heart failure and I suspect the pleural effusion is probably iatrogenic.  She may have had some form of while pleurisy however heart failure leading to her presentation is also likely.  For now although renal function is decreased slightly, continue spironolactone, she feels remarkably well, PND and orthopnea has completely resolved.  I'd like to see her back in 4 weeks for follow-up, I'll repeat BMP again. Although blood pressure is slightly elevated today I did not make any changes until renal function is stable and effusion is improved.  She still has persistent mild diarrhea that has started urine prior to therapy with spironolactone or torsemide.  C. difficile antigen negative.  Symptoms of diarrhea has improved and she has been taking Imodium on a p.r.n. basis.  Yates Decamp, MD, [facility] 11/13/2018, 5:23 PM [facility] [place] Pager: [phone] Office: [phone] If no answer Cell [phone]

## 2018-11-15 ENCOUNTER — Ambulatory Visit: Payer: Medicare Other | Admitting: Cardiology

## 2018-11-15 DIAGNOSIS — Z7982 Long term (current) use of aspirin: Secondary | ICD-10-CM | POA: Diagnosis not present

## 2018-11-15 DIAGNOSIS — I5033 Acute on chronic diastolic (congestive) heart failure: Secondary | ICD-10-CM | POA: Diagnosis not present

## 2018-11-15 DIAGNOSIS — I110 Hypertensive heart disease with heart failure: Secondary | ICD-10-CM | POA: Diagnosis not present

## 2018-11-15 DIAGNOSIS — E039 Hypothyroidism, unspecified: Secondary | ICD-10-CM | POA: Diagnosis not present

## 2018-11-15 DIAGNOSIS — Z9181 History of falling: Secondary | ICD-10-CM | POA: Diagnosis not present

## 2018-11-15 DIAGNOSIS — J90 Pleural effusion, not elsewhere classified: Secondary | ICD-10-CM | POA: Diagnosis not present

## 2018-11-19 DIAGNOSIS — Z7982 Long term (current) use of aspirin: Secondary | ICD-10-CM | POA: Diagnosis not present

## 2018-11-19 DIAGNOSIS — E039 Hypothyroidism, unspecified: Secondary | ICD-10-CM | POA: Diagnosis not present

## 2018-11-19 DIAGNOSIS — Z9181 History of falling: Secondary | ICD-10-CM | POA: Diagnosis not present

## 2018-11-19 DIAGNOSIS — I110 Hypertensive heart disease with heart failure: Secondary | ICD-10-CM | POA: Diagnosis not present

## 2018-11-19 DIAGNOSIS — I5033 Acute on chronic diastolic (congestive) heart failure: Secondary | ICD-10-CM | POA: Diagnosis not present

## 2018-11-19 DIAGNOSIS — J90 Pleural effusion, not elsewhere classified: Secondary | ICD-10-CM | POA: Diagnosis not present

## 2018-11-20 DIAGNOSIS — I110 Hypertensive heart disease with heart failure: Secondary | ICD-10-CM | POA: Diagnosis not present

## 2018-11-20 DIAGNOSIS — Z7982 Long term (current) use of aspirin: Secondary | ICD-10-CM | POA: Diagnosis not present

## 2018-11-20 DIAGNOSIS — I5033 Acute on chronic diastolic (congestive) heart failure: Secondary | ICD-10-CM | POA: Diagnosis not present

## 2018-11-20 DIAGNOSIS — Z9181 History of falling: Secondary | ICD-10-CM | POA: Diagnosis not present

## 2018-11-20 DIAGNOSIS — E039 Hypothyroidism, unspecified: Secondary | ICD-10-CM | POA: Diagnosis not present

## 2018-11-20 DIAGNOSIS — J90 Pleural effusion, not elsewhere classified: Secondary | ICD-10-CM | POA: Diagnosis not present

## 2018-11-24 DIAGNOSIS — Z9181 History of falling: Secondary | ICD-10-CM | POA: Diagnosis not present

## 2018-11-24 DIAGNOSIS — E039 Hypothyroidism, unspecified: Secondary | ICD-10-CM | POA: Diagnosis not present

## 2018-11-24 DIAGNOSIS — I110 Hypertensive heart disease with heart failure: Secondary | ICD-10-CM | POA: Diagnosis not present

## 2018-11-24 DIAGNOSIS — Z7982 Long term (current) use of aspirin: Secondary | ICD-10-CM | POA: Diagnosis not present

## 2018-11-24 DIAGNOSIS — I5033 Acute on chronic diastolic (congestive) heart failure: Secondary | ICD-10-CM | POA: Diagnosis not present

## 2018-11-24 DIAGNOSIS — J90 Pleural effusion, not elsewhere classified: Secondary | ICD-10-CM | POA: Diagnosis not present

## 2018-11-26 DIAGNOSIS — J90 Pleural effusion, not elsewhere classified: Secondary | ICD-10-CM | POA: Diagnosis not present

## 2018-11-26 DIAGNOSIS — Z9181 History of falling: Secondary | ICD-10-CM | POA: Diagnosis not present

## 2018-11-26 DIAGNOSIS — Z7982 Long term (current) use of aspirin: Secondary | ICD-10-CM | POA: Diagnosis not present

## 2018-11-26 DIAGNOSIS — I110 Hypertensive heart disease with heart failure: Secondary | ICD-10-CM | POA: Diagnosis not present

## 2018-11-26 DIAGNOSIS — I5033 Acute on chronic diastolic (congestive) heart failure: Secondary | ICD-10-CM | POA: Diagnosis not present

## 2018-11-26 DIAGNOSIS — E039 Hypothyroidism, unspecified: Secondary | ICD-10-CM | POA: Diagnosis not present

## 2018-12-03 LAB — ACID FAST CULTURE WITH REFLEXED SENSITIVITIES (MYCOBACTERIA): Acid Fast Culture: NEGATIVE

## 2018-12-10 DIAGNOSIS — E871 Hypo-osmolality and hyponatremia: Secondary | ICD-10-CM | POA: Diagnosis not present

## 2018-12-11 ENCOUNTER — Other Ambulatory Visit: Payer: Self-pay

## 2018-12-11 ENCOUNTER — Other Ambulatory Visit: Payer: Self-pay | Admitting: Cardiology

## 2018-12-11 ENCOUNTER — Ambulatory Visit (INDEPENDENT_AMBULATORY_CARE_PROVIDER_SITE_OTHER): Payer: Medicare Other | Admitting: Cardiology

## 2018-12-11 ENCOUNTER — Ambulatory Visit
Admission: RE | Admit: 2018-12-11 | Discharge: 2018-12-11 | Disposition: A | Discharge status: Home / Self Care | Payer: Medicare Other | Source: Ambulatory Visit | Attending: Cardiology | Admitting: Cardiology

## 2018-12-11 ENCOUNTER — Encounter: Payer: Self-pay | Admitting: Cardiology

## 2018-12-11 VITALS — BP 134/67 | HR 80 | Temp 97.7°F | Ht 60.0 in | Wt 92.0 lb

## 2018-12-11 DIAGNOSIS — J90 Pleural effusion, not elsewhere classified: Secondary | ICD-10-CM

## 2018-12-11 DIAGNOSIS — I129 Hypertensive chronic kidney disease with stage 1 through stage 4 chronic kidney disease, or unspecified chronic kidney disease: Secondary | ICD-10-CM

## 2018-12-11 DIAGNOSIS — I10 Essential (primary) hypertension: Secondary | ICD-10-CM

## 2018-12-11 DIAGNOSIS — N1831 Chronic kidney disease, stage 3a: Secondary | ICD-10-CM | POA: Diagnosis not present

## 2018-12-11 DIAGNOSIS — R197 Diarrhea, unspecified: Secondary | ICD-10-CM | POA: Diagnosis not present

## 2018-12-11 DIAGNOSIS — I5032 Chronic diastolic (congestive) heart failure: Secondary | ICD-10-CM | POA: Diagnosis not present

## 2018-12-11 LAB — BASIC METABOLIC PANEL
BUN/Creatinine Ratio: 10 — ABNORMAL LOW (ref 12–28)
BUN: 12 mg/dL (ref 8–27)
CO2: 21 mmol/L (ref 20–29)
Calcium: 9.7 mg/dL (ref 8.7–10.3)
Chloride: 100 mmol/L (ref 96–106)
Creatinine, Ser: 1.15 mg/dL — ABNORMAL HIGH (ref 0.57–1.00)
GFR calc Af Amer: 51 mL/min/{1.73_m2} — ABNORMAL LOW (ref >59)
GFR calc non Af Amer: 44 mL/min/{1.73_m2} — ABNORMAL LOW (ref >59)
Glucose: 83 mg/dL (ref 65–99)
Potassium: 5.1 mmol/L (ref 3.5–5.2)
Sodium: 134 mmol/L (ref 134–144)

## 2018-12-11 MED ORDER — DILTIAZEM HCL ER COATED BEADS 180 MG PO CP24: 180.0000 mg | ORAL_CAPSULE | Freq: Every day | ORAL | 2 refills | Status: DC

## 2018-12-11 NOTE — Progress Notes (Signed)
Primary Physician/Referring:  Lorenda Ishihara, MD  [name], female    DOB: [birth year], 84 y.o.   MRN: [id number]  Chief Complaint  Patient presents with  . Plerural effusion  . Follow-up    4 week   HPI:    Rebecca Foster  is a 84 y.o. Asian [place] female with mild hypertension, who had been complaining of gradually worsening fatigue, has also noticed worsening dyspnea, was seen in the emergency room on 10/19/2018 and discharged on 10/22/2018 after she underwent pleurocentesis of the right pleural effusion with aspiration of a 800 mL of straw-colored fluid, which was negative for exudative fluid or infection.   Patient presented with acute decompensated diastolic heart failure orthopnea, PND, bilateral pleural effusion.  On torsemide she developed severe hyponatremia and also worsening renal function however she was also started on spironolactone which she is tolerating.  Since being discharged from the hospital she continues to have frequent bowel movements and loose stools.  She now presents for a 4-week visit, states that her stent is improving, dyspnea has essentially resolved, no cough, but concerned about gradual weight loss.  No fever or chills.  No further PND or orthopnea and states she has started to exercise in the form of walking.   Past Medical History:  Diagnosis Date  . Hypertension    Past Surgical History:  Procedure Laterality Date  . lung tap    . NO PAST SURGERIES    . [place] PLEURAL EFFUSION BILATERAL ([facility] HX)  10/19/2018   Social History   Socioeconomic History  . Marital status: Widowed    Spouse name: Not on file  . Number of children: 3  . Years of education: 51  . Highest education level: Not on file  Occupational History  . Not on file  Social Needs  . Financial resource strain: Not on file  . Food insecurity    Worry: Not on file    Inability: Not on file  . Transportation needs    Medical: Not on file    Non-medical:  Not on file  Tobacco Use  . Smoking status: Never Smoker  . Smokeless tobacco: Never Used  Substance and Sexual Activity  . Alcohol use: No  . Drug use: No  . Sexual activity: Not on file  Lifestyle  . Physical activity    Days per week: Not on file    Minutes per session: Not on file  . Stress: Not on file  Relationships  . Social Musician on phone: Not on file    Gets together: Not on file    Attends religious service: Not on file    Active member of club or organization: Not on file    Attends meetings of clubs or organizations: Not on file    Relationship status: Not on file  . Intimate partner violence    Fear of current or ex partner: Not on file    Emotionally abused: Not on file    Physically abused: Not on file    Forced sexual activity: Not on file  Other Topics Concern  . Not on file  Social History Narrative   Lives alone in a one story home.  Has 3 daughters.  Education: high school.   ROS  Review of Systems  Constitution: Positive for weight loss. Negative for chills, decreased appetite, malaise/fatigue and weight gain.  Cardiovascular: Positive for dyspnea on exertion (minimal). Negative for leg swelling, orthopnea and syncope.  Endocrine:  Negative for cold intolerance.  Hematologic/Lymphatic: Does not bruise/bleed easily.  Musculoskeletal: Negative for joint swelling.  Gastrointestinal: Positive for anorexia and diarrhea. Negative for abdominal pain, change in bowel habit, hematochezia and melena.  Neurological: Negative for headaches and light-headedness.  Psychiatric/Behavioral: Negative for depression and substance abuse.  All other systems reviewed and are negative.  Objective   Vitals with BMI 12/11/2018 11/13/2018 11/01/2018  Height 5\' 0"  5\' 0"  5\' 0"   Weight 92 lbs 98 lbs 5 oz 100 lbs  BMI 17.97 19.2 19.53  Systolic 134 146  Diastolic 67 76 77  Pulse 80 78 96    Blood pressure 134/67, pulse 80, temperature 97.7 F (36.5 C),  height 5' (1.524 m), weight 92 lb (41.7 kg), SpO2 97 %. Body mass index is 17.97 kg/m.   Physical Exam  Constitutional:  She is moderately built  And petite in no acute distress  HENT:  Head: Atraumatic.  Eyes: Conjunctivae are normal.  Neck: Neck supple. No thyromegaly present.  Cardiovascular: Normal rate, regular rhythm, normal heart sounds and intact distal pulses. Exam reveals no gallop.  No murmur heard. No leg edema, no JVD.  Pulmonary/Chest: Effort normal. She has decreased breath sounds (clear left lung and decreased breath sounds right 1/3 base).  Abdominal: Soft. Bowel sounds are normal.  Musculoskeletal: Normal range of motion.  Neurological: She is alert.  Skin: Skin is warm and dry.  Psychiatric: She has a normal mood and affect.   Laboratory examination:   Recent Labs    10/21/18 0233 11/12/18 1153 12/10/18 1137  NA 132* 133* 134  K 4.4 5.4* 5.1  CL 98 96 100  CO2 22 24 21   GLUCOSE 94 90 83  BUN 8 7* 12  CREATININE 0.87 1.10* 1.15*  CALCIUM 8.6* 9.7 9.7  GFRNONAA >60 47* 44*  GFRAA >60 54* 51*   CMP Latest Ref Rng & Units 12/10/2018 11/12/2018 10/21/2018  Glucose 65 - 99 mg/dL 83 90 94  BUN 8 - 27 mg/dL 12 7(L) 8  Creatinine - 1.00 mg/dL 14/08/2018) 13/10/2018) 10/23/2018  Sodium 134 - 144 mmol/L 134 133(L) 132(L)  Potassium 3.5 - 5.2 mmol/L 5.1 5.4(H) 4.4  Chloride 96 - 106 mmol/L 100 96 98  CO2 20 - 29 mmol/L 21 24 22   Calcium 8.7 - 10.3 mg/dL 9.7 9.7 8.25)  Total Protein 6.5 - 8.1 g/dL - - -  Total Bilirubin 0.3 - 1.2 mg/dL - - -  Alkaline Phos 38 - 126 U/L - - -  AST 15 - 41 U/L - - -  ALT 0 - 44 U/L - - -   CBC Latest Ref Rng & Units 10/20/2018 10/19/2018 03/22/2016  WBC 4.0 - 10.5 K/uL 11.0(H) 10.1 7.3  Hemoglobin 12.0 - 15.0 g/dL 3.7(T 10/22/2018  Hematocrit 36.0 - 46.0 % 37.7 42.5 41.3  Platelets 150 - 400 K/uL 403(H) 398 209   BNP    Component Value Date/Time   BNP 110.4 (H) 11/12/2018 1154   BNP 156.5 (H) 10/20/2018 1135   ProBNP No  results found for: PROBNP  Lipid Panel  No results found for: CHOL, TRIG, HDL, CHOLHDL, VLDL, LDLCALC, LDLDIRECT HEMOGLOBIN A1C No results found for: HGBA1C, MPG TSH Recent Labs    11/01/18 1356  TSH 4.510*   Medications and allergies   Allergies  Allergen Reactions  . Amoxicillin Other (See Comments)    Extreme fatigue Did it involve swelling of the face/tongue/throat, SOB, or low BP? No Did it involve sudden  or severe rash/hives, skin peeling, or any reaction on the inside of your mouth or nose? No Did you need to seek medical attention at a hospital or doctor's office? No When did it last happen?[age] ish years old If all above answers are "NO", may proceed with cephalosporin use.  . Codeine Other (See Comments)    Higher dose - sensitive, burning sensation      Prior to Admission medications   Medication Sig Start Date End Date Taking? Authorizing Provider  furosemide (LASIX) 20 MG tablet Take 1 tablet (20 mg total) by mouth daily. 10/22/18 11/21/18 Yes Spongberg, Susy Frizzlehristopher N, MD  levothyroxine (SYNTHROID) 25 MCG tablet Take 25 mcg by mouth daily before breakfast.  08/24/18  Yes [provider]  Melatonin 3 MG TABS Take 1.5 mg by mouth at bedtime as needed (sleep).   Yes [provider]  metoprolol succinate (TOPROL-XL) 50 MG 24 hr tablet Take 1 tablet (50 mg total) by mouth 2 (two) times daily. Take with or immediately following a meal. Patient taking differently: Take 50 mg by mouth daily.  10/15/18  Yes Revankar, Aundra Dubinajan R, MD  potassium chloride (KLOR-CON) 10 MEQ tablet Take 1 tablet (10 mEq total) by mouth daily. 10/22/18 11/21/18 Yes Spongberg, Susy Frizzlehristopher N, MD  aspirin EC 81 MG tablet Take 81 mg by mouth daily.    [provider]     Current Outpatient Medications  Medication Instructions  . aspirin EC 81 mg, Oral, Daily  . levothyroxine (SYNTHROID) 25 mcg, Oral, Daily before breakfast  . loperamide (IMODIUM) 2 MG capsule Oral, As  needed  . Melatonin 1.5 mg, Oral, At bedtime PRN  . metoprolol succinate (TOPROL-XL) 50 mg, Oral, Daily, Take with or immediately following a meal.   . spironolactone (ALDACTONE) 25 mg, Oral, Daily    Radiology:   CXR PA/LAT 11/01/2018:  Probable underlying COPD with bibasilar pleural effusions and atelectasis, increased on RIGHT and slightly decreased on LEFT since prior exam.  Cardiac Studies:   Echocardiogram 10/20/2018:  1. Left ventricular ejection fraction, by visual estimation, is 65 to 70%. The left ventricle has hyperdynamic function. Decreased left ventricular size. There is mildly increased left ventricular hypertrophy.  2. Left ventricular diastolic Doppler parameters are consistent with impaired relaxation pattern of LV diastolic filling. 3. 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 Trace mitral valve regurgitation. 4. The tricuspid valve is grossly normal. Tricuspid valve regurgitation is mild.  The tricuspid regurgitant velocity is 3.34 m/s, and with an assumed right atrial pressure of 3 mmHg, the estimated right ventricular systolic pressure is moderately elevated at 47.6 mmHg. 5. Small pericardial effusion with signs of organization. The pericardial effusion is predominantly anterior to the right ventricle and lateral to the left ventricle, also trivial amount posteriorly. Mitral outflow pattern does not suggest hemodynamic significance.  Left pleural effusion is noted.  Assessment     ICD-10-CM   1. Chronic diastolic heart failure (HCC)  Z61.09I50.32   2. Pleural effusion, bilateral  J90   3. Stage 3a chronic kidney disease  N18.31     10/20/2018 Pleural fluid   glucose, Fluid mg/dL 89  Total protein, fluid g/dL 4.3   Fluid Type-FCT   Color, Fluid YELLOW YELLOW Abnormal   Appearance, Fluid CLEAR HAZY Abnormal   Total Nucleated Cell Count, Fluid 0 - 1,000 cu mm 1,225 High   Neutrophil Count, Fluid 0 - 25 % 15  Lymphs, Fluid % 55   Monocyte-Macrophage-Serous Fluid 50 - 90 % 21Low  Eos, Fluid % 0  Other Cells, Fluid % 9   Pathology: FINAL MICROSCOPIC DIAGNOSIS:  - No malignant cells identified  - Reactive mesothelial cells present   EKG 11/01/2018: Normal sinus rhythm at rate of 98 bpm, normal axis, poor R-wave progression, cannot exclude anteroseptal infarct old.  Diffuse nonspecific T abnormality.  No significant change from  EKG 10/19/2018  Recommendations:   Rebecca Foster  is a 84 y.o. Asian [place] female with mild hypertension, who had been complaining of gradually worsening fatigue, has also noticed worsening dyspnea, was seen in the emergency room on 10/19/2018 and discharged on 10/22/2018 after she underwent pleurocentesis of the right pleural effusion with aspiration of a 800 mL of straw-colored fluid, which was negative for exudative fluid or infection.   Patient presented with acute decompensated diastolic heart failure orthopnea, PND, bilateral pleural effusion. Has persistent diarrhea since hospitalization.   She is presently on spironolactone, has stable serum creatinine but has new stage III chronic kidney disease for the past 3 to 4 weeks.  Serum creatinine is remained stable and no further hyponatremia.  She continues to have persistent right pleural effusion on physical exam with decreased breath sounds.  Chest x-ray orders have been placed and she was encouraged to get this.  I reviewed the pleural aspirate, it appears to be exudative in 4.5 g of protein and also elevated WBC count.  I am beginning to wonder with weight loss, being Asian heritage, whether this is tuberculous pleural effusion.  If chest x-ray confirms recurrence of pleural effusion, she will need pleurocentesis and further work-up for noncardiac causes.  I have discussed diastolic heart failure with the patient's daughter at the bedside as well.  I would like to see him back in 4 weeks.  I will also set him up for a Lexiscan tetrofosmin  stress test for evaluation of CAD in view of recent CHF.  In view of persistent diarrhea, beta-blockers could induce this, and was started during hospitalization, will change her to diltiazem CD 180 mg daily.  If diarrhea still persist, she will need further work-up.  She has been C. difficile negative in the past.  Adrian Prows, MD, [facility] 12/11/2018, 3:50 PM [facility] [place] Pager: [phone] Office: [phone] If no answer Cell [phone]

## 2018-12-11 NOTE — Progress Notes (Signed)
Called pt to inform her about the lab results. Pt understood

## 2018-12-19 ENCOUNTER — Other Ambulatory Visit: Payer: Self-pay | Admitting: Cardiology

## 2018-12-19 ENCOUNTER — Other Ambulatory Visit (HOSPITAL_COMMUNITY): Payer: Self-pay | Admitting: Cardiology

## 2018-12-19 DIAGNOSIS — J90 Pleural effusion, not elsewhere classified: Secondary | ICD-10-CM

## 2018-12-19 DIAGNOSIS — I5032 Chronic diastolic (congestive) heart failure: Secondary | ICD-10-CM

## 2018-12-19 DIAGNOSIS — R0609 Other forms of dyspnea: Secondary | ICD-10-CM

## 2018-12-19 DIAGNOSIS — I10 Essential (primary) hypertension: Secondary | ICD-10-CM

## 2018-12-20 ENCOUNTER — Ambulatory Visit (HOSPITAL_COMMUNITY)
Admission: RE | Admit: 2018-12-20 | Discharge: 2018-12-20 | Disposition: A | Discharge status: Home / Self Care | Payer: Medicare Other | Source: Ambulatory Visit | Attending: Cardiology | Admitting: Cardiology

## 2018-12-20 ENCOUNTER — Other Ambulatory Visit (HOSPITAL_COMMUNITY): Payer: Self-pay | Admitting: Cardiology

## 2018-12-20 ENCOUNTER — Other Ambulatory Visit: Payer: Self-pay

## 2018-12-20 DIAGNOSIS — J90 Pleural effusion, not elsewhere classified: Secondary | ICD-10-CM | POA: Diagnosis not present

## 2018-12-20 HISTORY — PX: IR THORACENTESIS ASP PLEURAL SPACE W/IMG GUIDE: IMG5380

## 2018-12-20 LAB — BODY FLUID CELL COUNT WITH DIFFERENTIAL
Eos, Fluid: 0 %
Lymphs, Fluid: 86 %
Monocyte-Macrophage-Serous Fluid: 8 % — ABNORMAL LOW (ref 50–90)
Neutrophil Count, Fluid: 6 % (ref 0–25)
Total Nucleated Cell Count, Fluid: 1200 cu mm — ABNORMAL HIGH (ref 0–1000)

## 2018-12-20 LAB — GRAM STAIN

## 2018-12-20 LAB — PROTEIN, PLEURAL OR PERITONEAL FLUID: Total protein, fluid: 3.7 g/dL

## 2018-12-20 LAB — GLUCOSE, PLEURAL OR PERITONEAL FLUID: Glucose, Fluid: 111 mg/dL

## 2018-12-20 MED ORDER — LIDOCAINE HCL 1 % IJ SOLN
INTRAMUSCULAR | Status: AC
  Filled 2018-12-20: qty 20

## 2018-12-20 MED ORDER — LIDOCAINE HCL (PF) 1 % IJ SOLN
INTRAMUSCULAR | Status: DC | PRN
  Administered 2018-12-20: 10 mL

## 2018-12-20 NOTE — Procedures (Signed)
PROCEDURE SUMMARY:  Successful US guided right thoracentesis. Yielded 300 mL of clear yellow fluid. Pt tolerated procedure well. No immediate complications.  Specimen was sent for labs. CXR ordered.  EBL < 5 mL  Ascencion Dike PA-C 12/20/2018 2:01 PM

## 2018-12-23 ENCOUNTER — Other Ambulatory Visit: Payer: Self-pay

## 2018-12-23 ENCOUNTER — Ambulatory Visit (INDEPENDENT_AMBULATORY_CARE_PROVIDER_SITE_OTHER): Payer: Medicare Other

## 2018-12-23 DIAGNOSIS — R0609 Other forms of dyspnea: Secondary | ICD-10-CM | POA: Diagnosis not present

## 2018-12-23 DIAGNOSIS — I10 Essential (primary) hypertension: Secondary | ICD-10-CM | POA: Diagnosis not present

## 2018-12-23 DIAGNOSIS — I5032 Chronic diastolic (congestive) heart failure: Secondary | ICD-10-CM

## 2018-12-23 LAB — PATHOLOGIST SMEAR REVIEW

## 2018-12-24 DIAGNOSIS — J90 Pleural effusion, not elsewhere classified: Secondary | ICD-10-CM | POA: Diagnosis not present

## 2018-12-24 DIAGNOSIS — I5033 Acute on chronic diastolic (congestive) heart failure: Secondary | ICD-10-CM | POA: Diagnosis not present

## 2018-12-24 DIAGNOSIS — E039 Hypothyroidism, unspecified: Secondary | ICD-10-CM | POA: Diagnosis not present

## 2018-12-24 DIAGNOSIS — Z9181 History of falling: Secondary | ICD-10-CM | POA: Diagnosis not present

## 2018-12-24 DIAGNOSIS — I110 Hypertensive heart disease with heart failure: Secondary | ICD-10-CM | POA: Diagnosis not present

## 2018-12-25 LAB — CULTURE, BODY FLUID W GRAM STAIN -BOTTLE: Culture: NO GROWTH

## 2018-12-25 LAB — CULTURE, BODY FLUID-BOTTLE

## 2019-01-07 DIAGNOSIS — E039 Hypothyroidism, unspecified: Secondary | ICD-10-CM | POA: Diagnosis not present

## 2019-01-07 DIAGNOSIS — J90 Pleural effusion, not elsewhere classified: Secondary | ICD-10-CM | POA: Diagnosis not present

## 2019-01-07 DIAGNOSIS — B351 Tinea unguium: Secondary | ICD-10-CM | POA: Diagnosis not present

## 2019-01-07 DIAGNOSIS — I10 Essential (primary) hypertension: Secondary | ICD-10-CM | POA: Diagnosis not present

## 2019-01-13 ENCOUNTER — Institutional Professional Consult (permissible substitution): Payer: Medicare Other | Admitting: Pulmonary Disease

## 2019-01-22 ENCOUNTER — Encounter: Payer: Self-pay | Admitting: Cardiology

## 2019-01-22 ENCOUNTER — Other Ambulatory Visit: Payer: Self-pay

## 2019-01-22 ENCOUNTER — Ambulatory Visit (INDEPENDENT_AMBULATORY_CARE_PROVIDER_SITE_OTHER): Payer: Medicare Other | Admitting: Cardiology

## 2019-01-22 VITALS — BP 141/63 | HR 79 | Temp 98.0°F | Resp 18 | Ht 60.0 in | Wt 93.5 lb

## 2019-01-22 DIAGNOSIS — I10 Essential (primary) hypertension: Secondary | ICD-10-CM

## 2019-01-22 DIAGNOSIS — R0609 Other forms of dyspnea: Secondary | ICD-10-CM | POA: Diagnosis not present

## 2019-01-22 DIAGNOSIS — I5032 Chronic diastolic (congestive) heart failure: Secondary | ICD-10-CM | POA: Diagnosis not present

## 2019-01-22 NOTE — Telephone Encounter (Signed)
From patient.

## 2019-01-22 NOTE — Progress Notes (Signed)
Primary Physician/Referring:  Lorenda Ishihara, MD  [name], female    DOB: 1934-12-07, 85 y.o.   MRN: [id number]  Chief Complaint  Patient presents with  . Chronic Diastolic Heart Failure  . Results    Stress and Xray   HPI:    Rebecca Foster  is a 85 y.o. Asian [place] female with mild hypertension, who had been complaining of gradually worsening fatigue, has also noticed worsening dyspnea, was seen in the emergency room on 10/19/2018 and discharged on 10/22/2018 after she underwent pleurocentesis of the right pleural effusion. Due to recurrence underwent repeat tap 12/20/2018. She had elevated protein in the fluid suggestive of exudative effusion. She is also being treated for chronic diastolic CHF.  She now presents for a 6-week visit, no further dyspnea, weight is stable. No fever or chills.  No further PND or orthopnea and states she has started to exercise in the form of walking.   Past Medical History:  Diagnosis Date  . Hypertension    Past Surgical History:  Procedure Laterality Date  . IR THORACENTESIS ASP PLEURAL SPACE W/IMG GUIDE  12/20/2018  . lung tap    . NO PAST SURGERIES    . [place] PLEURAL EFFUSION BILATERAL ([facility] HX)  10/19/2018   Social History   Socioeconomic History  . Marital status: Widowed    Spouse name: Not on file  . Number of children: 3  . Years of education: 73  . Highest education level: Not on file  Occupational History  . Not on file  Tobacco Use  . Smoking status: Never Smoker  . Smokeless tobacco: Never Used  Substance and Sexual Activity  . Alcohol use: No  . Drug use: No  . Sexual activity: Not on file  Other Topics Concern  . Not on file  Social History Narrative   Lives alone in a one story home.  Has 3 daughters.  Education: high school.   Social Determinants of Health   Financial Resource Strain:   . Difficulty of Paying Living Expenses: Not on file  Food Insecurity:   . Worried About Brewing technologist in the Last Year: Not on file  . Ran Out of Food in the Last Year: Not on file  Transportation Needs:   . Lack of Transportation (Medical): Not on file  . Lack of Transportation (Non-Medical): Not on file  Physical Activity:   . Days of Exercise per Week: Not on file  . Minutes of Exercise per Session: Not on file  Stress:   . Feeling of Stress : Not on file  Social Connections:   . Frequency of Communication with Friends and Family: Not on file  . Frequency of Social Gatherings with Friends and Family: Not on file  . Attends Religious Services: Not on file  . Active Member of Clubs or Organizations: Not on file  . Attends Banker Meetings: Not on file  . Marital Status: Not on file  Intimate Partner Violence:   . Fear of Current or Ex-Partner: Not on file  . Emotionally Abused: Not on file  . Physically Abused: Not on file  . Sexually Abused: Not on file   ROS  Review of Systems  Constitution: Negative for chills, decreased appetite, malaise/fatigue, weight gain and weight loss.  Cardiovascular: Negative for dyspnea on exertion, leg swelling, orthopnea and syncope.  Endocrine: Negative for cold intolerance.  Hematologic/Lymphatic: Does not bruise/bleed easily.  Musculoskeletal: Negative for joint swelling.  Gastrointestinal: Positive  for change in bowel habit. Negative for abdominal pain, anorexia, diarrhea, hematochezia and melena.  Neurological: Negative for headaches and light-headedness.  Psychiatric/Behavioral: Negative for depression and substance abuse.  All other systems reviewed and are negative.  Objective   Vitals with BMI 01/22/2019 12/11/2018 11/13/2018  Height 5\' 0"  5\' 0"  5\' 0"   Weight 93 lbs 8 oz 92 lbs 98 lbs 5 oz  BMI 18.26 17.97 19.2  Systolic 141 134  Diastolic 63 67 76  Pulse 79 80 78    Blood pressure (!) 141/63, pulse 79, temperature 98 F (36.7 C), temperature source Temporal, resp. rate 18, height 5' (1.524 m), weight 93 lb 8  oz (42.4 kg), SpO2 95 %. Body mass index is 18.26 kg/m.   Physical Exam  Constitutional:  She is moderately built  and petite in no acute distress  HENT:  Head: Atraumatic.  Eyes: Conjunctivae are normal.  Neck: No thyromegaly present.  Cardiovascular: Normal rate, regular rhythm, normal heart sounds and intact distal pulses. Exam reveals no gallop.  No murmur heard. No leg edema, no JVD.  Pulmonary/Chest: Effort normal and breath sounds normal.  Abdominal: Soft. Bowel sounds are normal.  Musculoskeletal:        General: Normal range of motion.     Cervical back: Neck supple.  Neurological: She is alert.  Skin: Skin is warm and dry.  Psychiatric: She has a normal mood and affect.   Laboratory examination:   Recent Labs    10/21/18 0233 11/12/18 1153 12/10/18 1137  NA 132* 133* 134  K 4.4 5.4* 5.1  CL 98 96 100  CO2 22 24 21   GLUCOSE 94 90 83  BUN 8 7* 12  CREATININE 0.87 1.10* 1.15*  CALCIUM 8.6* 9.7 9.7  GFRNONAA >60 47* 44*  GFRAA >60 54* 51*   CMP Latest Ref Rng & Units 12/10/2018 11/12/2018 10/21/2018  Glucose 65 - 99 mg/dL 83 90 94  BUN 8 - 27 mg/dL 12 7(L) 8  Creatinine - 1.00 mg/dL 14/08/2018) 13/10/2018) 10/23/2018  Sodium 134 - 144 mmol/L 134 133(L) 132(L)  Potassium 3.5 - 5.2 mmol/L 5.1 5.4(H) 4.4  Chloride 96 - 106 mmol/L 100 96 98  CO2 20 - 29 mmol/L 21 24 22   Calcium 8.7 - 10.3 mg/dL 9.7 9.7 9.32)  Total Protein 6.5 - 8.1 g/dL - - -  Total Bilirubin 0.3 - 1.2 mg/dL - - -  Alkaline Phos 38 - 126 U/L - - -  AST 15 - 41 U/L - - -  ALT 0 - 44 U/L - - -   CBC Latest Ref Rng & Units 10/20/2018 10/19/2018 03/22/2016  WBC 4.0 - 10.5 K/uL 11.0(H) 10.1 7.3  Hemoglobin 12.0 - 15.0 g/dL 2.7(C 10/22/2018  Hematocrit 36.0 - 46.0 % 37.7 42.5 41.3  Platelets 150 - 400 K/uL 403(H) 398 209   BNP    Component Value Date/Time   BNP 110.4 (H) 11/12/2018 1154   BNP 156.5 (H) 10/20/2018 1135   ProBNP No results found for: PROBNP  Lipid Panel  No results found for:  CHOL, TRIG, HDL, CHOLHDL, VLDL, LDLCALC, LDLDIRECT HEMOGLOBIN A1C No results found for: HGBA1C, MPG TSH Recent Labs    11/01/18 1356  TSH 4.510*   Medications and allergies   Allergies  Allergen Reactions  . Amoxicillin Other (See Comments)    Extreme fatigue Did it involve swelling of the face/tongue/throat, SOB, or low BP? No Did it involve sudden or severe rash/hives, skin peeling, or  any reaction on the inside of your mouth or nose? No Did you need to seek medical attention at a hospital or doctor's office? No When did it last happen?23 ish years old If all above answers are "NO", may proceed with cephalosporin use.  . Codeine Other (See Comments)    Higher dose - sensitive, burning sensation     Current Outpatient Medications  Medication Instructions  . aspirin EC 81 mg, Oral, Daily  . diltiazem (CARDIZEM CD) 180 mg, Oral, Daily  . levothyroxine (SYNTHROID) 25 mcg, Oral, Daily before breakfast  . Melatonin 1.5 mg, Oral, At bedtime PRN  . spironolactone (ALDACTONE) 25 mg, Oral, Daily    Radiology:   CXR PA/LAT 11/01/2018:  Probable underlying COPD with bibasilar pleural effusions and atelectasis, increased on RIGHT and slightly decreased on LEFT since prior exam.  CXR PA/LAT 12/20/2018:  The heart size and mediastinal contours are within normal limits. Both lungs are clear. No pneumothorax or pleural effusion is noted. The visualized skeletal structures are unremarkable. IMPRESSION: No active disease.  Cardiac Studies:   Echocardiogram 10/20/2018:  1. Left ventricular ejection fraction, by visual estimation, is 65 to 70%. The left ventricle has hyperdynamic function. Decreased left ventricular size. There is mildly increased left ventricular hypertrophy.  2. Left ventricular diastolic Doppler parameters are consistent with impaired relaxation pattern of LV diastolic filling. 3. 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 Trace mitral valve  regurgitation. 4. The tricuspid valve is grossly normal. Tricuspid valve regurgitation is mild.  The tricuspid regurgitant velocity is 3.34 m/s, and with an assumed right atrial pressure of 3 mmHg, the estimated right ventricular systolic pressure is moderately elevated at 47.6 mmHg. 5. Small pericardial effusion with signs of organization. The pericardial effusion is predominantly anterior to the right ventricle and lateral to the left ventricle, also trivial amount posteriorly. Mitral outflow pattern does not suggest hemodynamic significance.  Left pleural effusion is noted.  Lexiscan Tetrofosmin Stress Test  12/23/2018: Nondiagnostic ECG stress. Normal myocardial perfusion. Stress LV EF: 59%.   Low risk study. No previous exam available for comparison.  Assessment     ICD-10-CM   1. Chronic diastolic heart failure (HCC)  H29.92   2. Essential hypertension  I10   3. Dyspnea on exertion  R06.00     10/20/2018 Pleural fluid   glucose, Fluid mg/dL 89  Total protein, fluid g/dL 4.3   Fluid Type-FCT   Color, Fluid YELLOW YELLOW Abnormal   Appearance, Fluid CLEAR HAZY Abnormal   Total Nucleated Cell Count, Fluid 0 - 1,000 cu mm 1,225 High   Neutrophil Count, Fluid 0 - 25 % 15  Lymphs, Fluid % 55  Monocyte-Macrophage-Serous Fluid 50 - 90 % 21Low   Eos, Fluid % 0  Other Cells, Fluid % 9   Pathology: FINAL MICROSCOPIC DIAGNOSIS:  - No malignant cells identified  - Reactive mesothelial cells present   EKG 11/01/2018: Normal sinus rhythm at rate of 98 bpm, normal axis, poor R-wave progression, cannot exclude anteroseptal infarct old.  Diffuse nonspecific T abnormality.  No significant change from  EKG 10/19/2018  Recommendations:   Rebecca Foster  is a 85 y.o. Asian [place] female with mild hypertension, who had been complaining of gradually worsening fatigue, has also noticed worsening dyspnea, was seen in the emergency room on 10/19/2018 and discharged on 10/22/2018 after she underwent  pleurocentesis of the right pleural effusion. Due to recurrence underwent repeat tap 12/20/2018. She had elevated protein in the fluid suggestive of exudative  effusion. She is also being treated for chronic diastolic CHF. Nuclear stress negative for ischemia.  Since being on spironolactone, she has not had any further leg edema and dyspnea is completely resolved.  Physical examination today her lungs were completely clear.  Blood pressure is also very well controlled.  She still continues to have increased frequency of bowels but no further diarrhea.  Weight has remained stable.  From cardiac standpoint no further evaluation is indicated, I will see her back on a as needed basis.  Suspect chronic diastolic heart failure and probably some form of pleurisy may have presented this way.  She will contact me if you were to develop any worsening dyspnea or leg edema.  Adrian Prows, MD, [facility] 01/22/2019, 10:26 PM [facility]

## 2019-01-23 DIAGNOSIS — I110 Hypertensive heart disease with heart failure: Secondary | ICD-10-CM | POA: Diagnosis not present

## 2019-01-23 DIAGNOSIS — Z9181 History of falling: Secondary | ICD-10-CM | POA: Diagnosis not present

## 2019-01-23 DIAGNOSIS — J90 Pleural effusion, not elsewhere classified: Secondary | ICD-10-CM | POA: Diagnosis not present

## 2019-01-23 DIAGNOSIS — E039 Hypothyroidism, unspecified: Secondary | ICD-10-CM | POA: Diagnosis not present

## 2019-01-23 DIAGNOSIS — I5033 Acute on chronic diastolic (congestive) heart failure: Secondary | ICD-10-CM | POA: Diagnosis not present

## 2019-01-29 ENCOUNTER — Other Ambulatory Visit: Payer: Self-pay | Admitting: Cardiology

## 2019-01-29 DIAGNOSIS — J90 Pleural effusion, not elsewhere classified: Secondary | ICD-10-CM

## 2019-03-06 ENCOUNTER — Other Ambulatory Visit: Payer: Self-pay | Admitting: Cardiology

## 2019-03-06 DIAGNOSIS — I10 Essential (primary) hypertension: Secondary | ICD-10-CM

## 2019-05-31 ENCOUNTER — Other Ambulatory Visit: Payer: Self-pay | Admitting: Cardiology

## 2019-05-31 DIAGNOSIS — J90 Pleural effusion, not elsewhere classified: Secondary | ICD-10-CM

## 2019-07-16 DIAGNOSIS — I10 Essential (primary) hypertension: Secondary | ICD-10-CM | POA: Diagnosis not present

## 2019-07-16 DIAGNOSIS — R630 Anorexia: Secondary | ICD-10-CM | POA: Diagnosis not present

## 2019-07-16 DIAGNOSIS — R079 Chest pain, unspecified: Secondary | ICD-10-CM | POA: Diagnosis not present

## 2019-09-10 ENCOUNTER — Other Ambulatory Visit: Payer: Self-pay | Admitting: Cardiology

## 2019-09-10 DIAGNOSIS — J90 Pleural effusion, not elsewhere classified: Secondary | ICD-10-CM

## 2020-01-13 ENCOUNTER — Ambulatory Visit
Admission: RE | Admit: 2020-01-13 | Discharge: 2020-01-13 | Disposition: A | Discharge status: Home / Self Care | Payer: Medicare Other | Source: Ambulatory Visit | Attending: Internal Medicine | Admitting: Internal Medicine

## 2020-01-13 ENCOUNTER — Other Ambulatory Visit: Payer: Self-pay | Admitting: Internal Medicine

## 2020-01-13 DIAGNOSIS — Z0000 Encounter for general adult medical examination without abnormal findings: Secondary | ICD-10-CM | POA: Diagnosis not present

## 2020-01-13 DIAGNOSIS — J90 Pleural effusion, not elsewhere classified: Secondary | ICD-10-CM

## 2020-01-13 DIAGNOSIS — Z1389 Encounter for screening for other disorder: Secondary | ICD-10-CM | POA: Diagnosis not present

## 2020-01-13 DIAGNOSIS — I10 Essential (primary) hypertension: Secondary | ICD-10-CM | POA: Diagnosis not present

## 2020-01-13 DIAGNOSIS — E039 Hypothyroidism, unspecified: Secondary | ICD-10-CM | POA: Diagnosis not present

## 2020-01-13 DIAGNOSIS — R918 Other nonspecific abnormal finding of lung field: Secondary | ICD-10-CM | POA: Diagnosis not present

## 2020-01-13 DIAGNOSIS — R630 Anorexia: Secondary | ICD-10-CM | POA: Diagnosis not present

## 2020-01-13 DIAGNOSIS — M810 Age-related osteoporosis without current pathological fracture: Secondary | ICD-10-CM | POA: Diagnosis not present

## 2020-01-13 DIAGNOSIS — R531 Weakness: Secondary | ICD-10-CM | POA: Diagnosis not present

## 2020-01-13 DIAGNOSIS — E441 Mild protein-calorie malnutrition: Secondary | ICD-10-CM | POA: Diagnosis not present

## 2020-01-13 DIAGNOSIS — Z8709 Personal history of other diseases of the respiratory system: Secondary | ICD-10-CM | POA: Diagnosis not present

## 2020-01-13 DIAGNOSIS — I5032 Chronic diastolic (congestive) heart failure: Secondary | ICD-10-CM | POA: Diagnosis not present

## 2020-01-16 ENCOUNTER — Ambulatory Visit: Payer: Medicare Other | Admitting: Cardiology

## 2020-04-15 DIAGNOSIS — H6121 Impacted cerumen, right ear: Secondary | ICD-10-CM | POA: Diagnosis not present

## 2020-04-23 DIAGNOSIS — M810 Age-related osteoporosis without current pathological fracture: Secondary | ICD-10-CM | POA: Diagnosis not present

## 2020-04-23 DIAGNOSIS — I5032 Chronic diastolic (congestive) heart failure: Secondary | ICD-10-CM | POA: Diagnosis not present

## 2020-04-23 DIAGNOSIS — J301 Allergic rhinitis due to pollen: Secondary | ICD-10-CM | POA: Diagnosis not present

## 2020-04-23 DIAGNOSIS — E441 Mild protein-calorie malnutrition: Secondary | ICD-10-CM | POA: Diagnosis not present

## 2020-04-23 DIAGNOSIS — I10 Essential (primary) hypertension: Secondary | ICD-10-CM | POA: Diagnosis not present

## 2020-06-14 DIAGNOSIS — J301 Allergic rhinitis due to pollen: Secondary | ICD-10-CM | POA: Diagnosis not present

## 2020-06-14 DIAGNOSIS — K5900 Constipation, unspecified: Secondary | ICD-10-CM | POA: Diagnosis not present

## 2020-06-14 DIAGNOSIS — I7300 Raynaud's syndrome without gangrene: Secondary | ICD-10-CM | POA: Diagnosis not present

## 2020-06-14 DIAGNOSIS — M170 Bilateral primary osteoarthritis of knee: Secondary | ICD-10-CM | POA: Diagnosis not present

## 2020-06-14 DIAGNOSIS — Z7982 Long term (current) use of aspirin: Secondary | ICD-10-CM | POA: Diagnosis not present

## 2020-06-14 DIAGNOSIS — I5032 Chronic diastolic (congestive) heart failure: Secondary | ICD-10-CM | POA: Diagnosis not present

## 2020-06-14 DIAGNOSIS — M810 Age-related osteoporosis without current pathological fracture: Secondary | ICD-10-CM | POA: Diagnosis not present

## 2020-06-14 DIAGNOSIS — D134 Benign neoplasm of liver: Secondary | ICD-10-CM | POA: Diagnosis not present

## 2020-06-14 DIAGNOSIS — H903 Sensorineural hearing loss, bilateral: Secondary | ICD-10-CM | POA: Diagnosis not present

## 2020-06-14 DIAGNOSIS — Z9181 History of falling: Secondary | ICD-10-CM | POA: Diagnosis not present

## 2020-06-14 DIAGNOSIS — F419 Anxiety disorder, unspecified: Secondary | ICD-10-CM | POA: Diagnosis not present

## 2020-06-14 DIAGNOSIS — I110 Hypertensive heart disease with heart failure: Secondary | ICD-10-CM | POA: Diagnosis not present

## 2020-06-14 DIAGNOSIS — G44209 Tension-type headache, unspecified, not intractable: Secondary | ICD-10-CM | POA: Diagnosis not present

## 2020-06-14 DIAGNOSIS — I313 Pericardial effusion (noninflammatory): Secondary | ICD-10-CM | POA: Diagnosis not present

## 2020-06-14 DIAGNOSIS — E039 Hypothyroidism, unspecified: Secondary | ICD-10-CM | POA: Diagnosis not present

## 2020-06-14 DIAGNOSIS — E441 Mild protein-calorie malnutrition: Secondary | ICD-10-CM | POA: Diagnosis not present

## 2020-06-14 DIAGNOSIS — M19021 Primary osteoarthritis, right elbow: Secondary | ICD-10-CM | POA: Diagnosis not present

## 2020-07-14 DIAGNOSIS — M170 Bilateral primary osteoarthritis of knee: Secondary | ICD-10-CM | POA: Diagnosis not present

## 2020-07-14 DIAGNOSIS — I313 Pericardial effusion (noninflammatory): Secondary | ICD-10-CM | POA: Diagnosis not present

## 2020-07-14 DIAGNOSIS — I5032 Chronic diastolic (congestive) heart failure: Secondary | ICD-10-CM | POA: Diagnosis not present

## 2020-07-14 DIAGNOSIS — I110 Hypertensive heart disease with heart failure: Secondary | ICD-10-CM | POA: Diagnosis not present

## 2020-07-14 DIAGNOSIS — E441 Mild protein-calorie malnutrition: Secondary | ICD-10-CM | POA: Diagnosis not present

## 2020-07-14 DIAGNOSIS — D134 Benign neoplasm of liver: Secondary | ICD-10-CM | POA: Diagnosis not present

## 2020-07-14 DIAGNOSIS — G44209 Tension-type headache, unspecified, not intractable: Secondary | ICD-10-CM | POA: Diagnosis not present

## 2020-07-14 DIAGNOSIS — I7300 Raynaud's syndrome without gangrene: Secondary | ICD-10-CM | POA: Diagnosis not present

## 2020-07-14 DIAGNOSIS — K5900 Constipation, unspecified: Secondary | ICD-10-CM | POA: Diagnosis not present

## 2020-07-14 DIAGNOSIS — J301 Allergic rhinitis due to pollen: Secondary | ICD-10-CM | POA: Diagnosis not present

## 2020-07-14 DIAGNOSIS — H903 Sensorineural hearing loss, bilateral: Secondary | ICD-10-CM | POA: Diagnosis not present

## 2020-07-14 DIAGNOSIS — M810 Age-related osteoporosis without current pathological fracture: Secondary | ICD-10-CM | POA: Diagnosis not present

## 2020-07-14 DIAGNOSIS — F419 Anxiety disorder, unspecified: Secondary | ICD-10-CM | POA: Diagnosis not present

## 2020-07-14 DIAGNOSIS — Z7982 Long term (current) use of aspirin: Secondary | ICD-10-CM | POA: Diagnosis not present

## 2020-07-14 DIAGNOSIS — Z9181 History of falling: Secondary | ICD-10-CM | POA: Diagnosis not present

## 2020-07-14 DIAGNOSIS — E039 Hypothyroidism, unspecified: Secondary | ICD-10-CM | POA: Diagnosis not present

## 2020-07-14 DIAGNOSIS — M19021 Primary osteoarthritis, right elbow: Secondary | ICD-10-CM | POA: Diagnosis not present

## 2020-09-29 ENCOUNTER — Ambulatory Visit: Payer: Medicare Other | Admitting: Cardiology

## 2020-09-29 ENCOUNTER — Encounter: Payer: Self-pay | Admitting: Cardiology

## 2020-09-29 ENCOUNTER — Other Ambulatory Visit: Payer: Self-pay

## 2020-09-29 VITALS — BP 162/75 | HR 61 | Temp 97.8°F | Resp 16 | Ht 60.0 in | Wt 95.0 lb

## 2020-09-29 DIAGNOSIS — I10 Essential (primary) hypertension: Secondary | ICD-10-CM

## 2020-09-29 DIAGNOSIS — R001 Bradycardia, unspecified: Secondary | ICD-10-CM | POA: Diagnosis not present

## 2020-09-29 DIAGNOSIS — N1831 Chronic kidney disease, stage 3a: Secondary | ICD-10-CM | POA: Diagnosis not present

## 2020-09-29 DIAGNOSIS — I5032 Chronic diastolic (congestive) heart failure: Secondary | ICD-10-CM

## 2020-09-29 DIAGNOSIS — I129 Hypertensive chronic kidney disease with stage 1 through stage 4 chronic kidney disease, or unspecified chronic kidney disease: Secondary | ICD-10-CM | POA: Diagnosis not present

## 2020-09-29 MED ORDER — LOSARTAN POTASSIUM-HCTZ 50-12.5 MG PO TABS: 1.0000 | ORAL_TABLET | Freq: Every morning | ORAL | 2 refills | Status: DC

## 2020-09-29 MED ORDER — DILTIAZEM HCL ER COATED BEADS 120 MG PO CP24: 120.0000 mg | ORAL_CAPSULE | Freq: Every evening | ORAL | 2 refills | Status: DC

## 2020-09-29 NOTE — Progress Notes (Signed)
Primary Physician/Referring:  Lorenda Ishihara, MD  [name], female    DOB: 03-14-1934, 86 y.o.   MRN: [id number]  Chief Complaint  Patient presents with  . Chronic diastolic heart failure   . Follow-up    1 year   HPI:    Rebecca Foster  is a 86 y.o. Asian [place] female with mild hypertension, chronic diastolic heart failure, recurrent pleural effusion in 2020 which is now resolved. Nuclear stress negative for ischemia.  An echocardiogram had revealed hyperdynamic LVEF.  Patient made an appointment to see me to follow-up on diastolic heart failure, hypertension.  States that she has gone back to doing all of physical activity without limitation.  She has not had any further dyspnea, no PND or orthopnea or leg edema.  Past Medical History:  Diagnosis Date  . Hypertension    Past Surgical History:  Procedure Laterality Date  . IR THORACENTESIS ASP PLEURAL SPACE W/IMG GUIDE  12/20/2018  . lung tap    . NO PAST SURGERIES    . [place] PLEURAL EFFUSION BILATERAL ([facility] HX)  10/19/2018   Social History   Socioeconomic History  . Marital status: Widowed    Spouse name: Not on file  . Number of children: 3  . Years of education: 7  . Highest education level: Not on file  Occupational History  . Not on file  Tobacco Use  . Smoking status: Never  . Smokeless tobacco: Never  Vaping Use  . Vaping Use: Never used  Substance and Sexual Activity  . Alcohol use: No  . Drug use: No  . Sexual activity: Not on file  Other Topics Concern  . Not on file  Social History Narrative   Lives alone in a one story home.  Has 3 daughters.  Education: high school.   Social Determinants of Health   Financial Resource Strain: Not on file  Food Insecurity: Not on file  Transportation Needs: Not on file  Physical Activity: Not on file  Stress: Not on file  Social Connections: Not on file  Intimate Partner Violence: Not on file   ROS  Review of Systems   Cardiovascular:  Negative for chest pain, dyspnea on exertion and leg swelling.  Gastrointestinal:  Negative for melena.  Objective   Vitals with BMI 09/29/2020 01/22/2019 12/11/2018  Height 5\' 0"  5\' 0"  5\' 0"   Weight 95 lbs 93 lbs 8 oz 92 lbs  BMI 18.55 18.26 17.97  Systolic 162 141  Diastolic 75 63 67  Pulse 61 79 80    Blood pressure (!) 162/75, pulse 61, temperature 97.8 F (36.6 C), temperature source Temporal, resp. rate 16, height 5' (1.524 m), weight 95 lb (43.1 kg), SpO2 97 %. Body mass index is 18.55 kg/m.   Physical Exam Constitutional:      Comments: She is moderately built  and petite in no acute distress  Neck:     Thyroid: No thyromegaly.     Vascular: No carotid bruit or JVD.  Cardiovascular:     Rate and Rhythm: Regular rhythm. Bradycardia present.     Pulses: Normal pulses and intact distal pulses.     Heart sounds: Normal heart sounds. No murmur heard.   No gallop.  Pulmonary:     Effort: Pulmonary effort is normal.     Breath sounds: Normal breath sounds.  Abdominal:     General: Bowel sounds are normal.     Palpations: Abdomen is soft.  Musculoskeletal:  General: No swelling. Normal range of motion.     Cervical back: Neck supple.  Skin:    General: Skin is warm and dry.   Laboratory examination:   No results for input(s): NA, K, CL, CO2, GLUCOSE, BUN, CREATININE, CALCIUM, GFRNONAA, GFRAA in the last 8760 hours.  CMP Latest Ref Rng & Units 12/10/2018 11/12/2018 10/21/2018  Glucose 65 - 99 mg/dL 83 90 94  BUN 8 - 27 mg/dL 12 7(L) 8  Creatinine 9.60 - 1.00 mg/dL 4.54(U) 9.81(X) 9.14  Sodium 134 - 144 mmol/L 134 133(L) 132(L)  Potassium 3.5 - 5.2 mmol/L 5.1 5.4(H) 4.4  Chloride 96 - 106 mmol/L 100 96 98  CO2 20 - 29 mmol/L 21 24 22   Calcium 8.7 - 10.3 mg/dL 9.7 9.7 )  Total Protein 6.5 - 8.1 g/dL - - -  Total Bilirubin 0.3 - 1.2 mg/dL - - -  Alkaline Phos 38 - 126 U/L - - -  AST 15 - 41 U/L - - -  ALT 0 - 44 U/L - - -   CBC  Latest Ref Rng & Units 10/20/2018 10/19/2018 03/22/2016  WBC 4.0 - 10.5 K/uL 11.0(H) 10.1 7.3  Hemoglobin 12.0 - 15.0 g/dL 03/24/2016 95.6 21.3  Hematocrit 36.0 - 46.0 % 37.7 42.5 41.3  Platelets 150 - 400 K/uL 403(H) 398 209   BNP    Component Value Date/Time   BNP 110.4 (H) 11/12/2018 1154   BNP 156.5 (H) 10/20/2018 1135   External labs: Cholesterol, total 174.000 m 01/13/2020 HDL 60.000 mg 01/13/2020 LDL 96.000 mg 01/13/2020 Triglycerides 101.000 m 01/13/2020  Hemoglobin 13.000 g/d 01/13/2020  Creatinine, Serum 1.180 mg/ 01/13/2020 Potassium 4.000 mm 01/13/2020 ALT (SGPT) 8.000 U/L 01/13/2020  TSH 1.380 01/13/2020  Medications and allergies   Allergies  Allergen Reactions  . Amoxicillin Other (See Comments)    Extreme fatigue Did it involve swelling of the face/tongue/throat, SOB, or low BP? No Did it involve sudden or severe rash/hives, skin peeling, or any reaction on the inside of your mouth or nose? No Did you need to seek medical attention at a hospital or doctor's office? No When did it last happen?  86 ish years old     If all above answers are "NO", may proceed with cephalosporin use.  . Codeine Other (See Comments)    Higher dose - sensitive, burning sensation     Current Outpatient Medications  Medication Instructions  . diltiazem (CARDIZEM CD) 120 mg, Oral, Every evening  . levothyroxine (SYNTHROID) 25 mcg, Oral, Daily before breakfast  . losartan-hydrochlorothiazide (HYZAAR) 50-12.5 MG tablet 1 tablet, Oral, Every morning    Radiology:   CXR PA/LAT 11/01/2018:  Probable underlying COPD with bibasilar pleural effusions and atelectasis, increased on RIGHT and slightly decreased on LEFT since prior exam.  CXR PA/LAT 12/20/2018:  The heart size and mediastinal contours are within normal limits. Both lungs are clear. No pneumothorax or pleural effusion is noted. The visualized skeletal structures are unremarkable. IMPRESSION: No active disease.  Cardiac Studies:    Echocardiogram 10/20/2018:  1. Left ventricular ejection fraction, by visual estimation, is 65 to 70%. The left ventricle has hyperdynamic function. Decreased left ventricular size. There is mildly increased left ventricular hypertrophy.  2. Left ventricular diastolic Doppler parameters are consistent with impaired relaxation pattern of LV diastolic filling. 3. 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 Trace mitral valve regurgitation. 4. The tricuspid valve is grossly normal. Tricuspid valve regurgitation is mild.  The tricuspid regurgitant velocity is  3.34 m/s, and with an assumed right atrial pressure of 3 mmHg, the estimated right ventricular systolic pressure is moderately elevated at 47.6 mmHg. 5. Small pericardial effusion with signs of organization. The pericardial effusion is predominantly anterior to the right ventricle and lateral to the left ventricle, also trivial amount posteriorly. Mitral outflow pattern does not suggest hemodynamic significance.  Left pleural effusion is noted.  Lexiscan Tetrofosmin Stress Test  12/23/2018: Nondiagnostic ECG stress. Normal myocardial perfusion. Stress LV EF: 59%.   Low risk study. No previous exam available for comparison.  EKG:   EKG Marked sinus bradycardia at the rate of 54 bpm, incomplete right bundle branch block, nonspecific T abnormality.  No significant change from prior EKG 11/01/2018, sinus bradycardia new compared to heart rate of 98 bpm.  Assessment     ICD-10-CM   1. Chronic diastolic heart failure (HCC)  V78.58 EKG 12-Lead    2. Essential hypertension  I10 Basic metabolic panel    TSH    3. Stage 3a chronic kidney disease (HCC)  N18.31     4. Bradycardia by electrocardiogram  R00.1        Recommendations:   Rebecca Foster  is a 86 y.o. Asian [place] female with mild hypertension, chronic diastolic heart failure, recurrent pleural effusion in 2020 which is now resolved. Nuclear stress negative for  ischemia.  An echocardiogram had revealed hyperdynamic LVEF.  I had seen her 1.5 years ago for recurrent pleural effusion and chronic diastolic heart failure.  She is presently doing well and essentially asymptomatic.  She is got marked sinus bradycardia on EKG, she is on fairly moderate dose of diltiazem, I will reduce the dose of diltiazem from 180 mg to 120 mg daily.    Also in view of chronic stage III kidney disease and chronic diastolic heart failure, would like to try losartan HCT 50/12.5 mg in the morning.  Will obtain a BMP along with repeat TSH in 2 to 3 weeks and we will see her back in 6 weeks, we will also repeat EKG at that time and if she remains stable we will see her back on a as needed basis.    Rebecca Decamp, MD, [facility] 09/29/2020, 1:40 PM Office: [phone] Fax: [phone] Pager: [phone]

## 2020-10-18 DIAGNOSIS — I10 Essential (primary) hypertension: Secondary | ICD-10-CM | POA: Diagnosis not present

## 2020-10-19 ENCOUNTER — Other Ambulatory Visit: Payer: Self-pay | Admitting: Student

## 2020-10-19 DIAGNOSIS — I10 Essential (primary) hypertension: Secondary | ICD-10-CM

## 2020-10-19 LAB — BASIC METABOLIC PANEL
BUN/Creatinine Ratio: 18 (ref 12–28)
BUN: 29 mg/dL — ABNORMAL HIGH (ref 8–27)
CO2: 24 mmol/L (ref 20–29)
Calcium: 9.4 mg/dL (ref 8.7–10.3)
Chloride: 99 mmol/L (ref 96–106)
Creatinine, Ser: 1.6 mg/dL — ABNORMAL HIGH (ref 0.57–1.00)
Glucose: 94 mg/dL (ref 70–99)
Potassium: 4.5 mmol/L (ref 3.5–5.2)
Sodium: 138 mmol/L (ref 134–144)
eGFR: 32 mL/min/{1.73_m2} — ABNORMAL LOW (ref >59)

## 2020-10-19 LAB — TSH: TSH: 2.34 u[IU]/mL (ref 0.450–4.500)

## 2020-10-19 NOTE — Progress Notes (Signed)
I had started Losartan HCT 50/12.5. Please ask her to stop that and recheck in 2 weeks. Probably not treat her with ACEi or just try Losartan 12.5 mg in 1 month. TSH normal

## 2020-11-04 NOTE — Telephone Encounter (Signed)
From pt

## 2020-11-05 ENCOUNTER — Telehealth: Payer: Self-pay | Admitting: Cardiology

## 2020-11-05 DIAGNOSIS — I10 Essential (primary) hypertension: Secondary | ICD-10-CM | POA: Diagnosis not present

## 2020-11-05 NOTE — Telephone Encounter (Signed)
Patient was last seen by me had elevated blood pressure, I had reduced the dose of diltiazem from 180 to 120 mg in view of lower heart rate and also added losartan HCT and obtain BMP which revealed acute renal failure.  Immediately the medication was discontinued and I am awaiting BMP which should be available to me soon as she patient had this done this morning.  I have been in touch with them on MyChart messaging, today I discussed with him that if renal function is normal, her fatigue and tiredness may be unrelated to cardiac issues.  I will continue to follow.

## 2020-11-06 LAB — BASIC METABOLIC PANEL
BUN/Creatinine Ratio: 16 (ref 12–28)
BUN: 19 mg/dL (ref 8–27)
CO2: 26 mmol/L (ref 20–29)
Calcium: 9 mg/dL (ref 8.7–10.3)
Chloride: 103 mmol/L (ref 96–106)
Creatinine, Ser: 1.19 mg/dL — ABNORMAL HIGH (ref 0.57–1.00)
Glucose: 122 mg/dL — ABNORMAL HIGH (ref 70–99)
Potassium: 4.6 mmol/L (ref 3.5–5.2)
Sodium: 138 mmol/L (ref 134–144)
eGFR: 45 mL/min/{1.73_m2} — ABNORMAL LOW (ref >59)

## 2020-11-07 DIAGNOSIS — Z23 Encounter for immunization: Secondary | ICD-10-CM | POA: Diagnosis not present

## 2020-11-10 ENCOUNTER — Encounter: Payer: Self-pay | Admitting: Student

## 2020-11-10 ENCOUNTER — Other Ambulatory Visit: Payer: Self-pay

## 2020-11-10 ENCOUNTER — Ambulatory Visit: Payer: Medicare Other | Admitting: Student

## 2020-11-10 VITALS — BP 152/78 | HR 76 | Temp 98.2°F | Ht 60.0 in | Wt 96.8 lb

## 2020-11-10 DIAGNOSIS — I10 Essential (primary) hypertension: Secondary | ICD-10-CM

## 2020-11-10 DIAGNOSIS — I5032 Chronic diastolic (congestive) heart failure: Secondary | ICD-10-CM | POA: Diagnosis not present

## 2020-11-10 DIAGNOSIS — N1831 Chronic kidney disease, stage 3a: Secondary | ICD-10-CM | POA: Diagnosis not present

## 2020-11-10 DIAGNOSIS — I129 Hypertensive chronic kidney disease with stage 1 through stage 4 chronic kidney disease, or unspecified chronic kidney disease: Secondary | ICD-10-CM | POA: Diagnosis not present

## 2020-11-10 MED ORDER — HYDRALAZINE HCL 25 MG PO TABS: 25.0000 mg | ORAL_TABLET | Freq: Three times a day (TID) | ORAL | 3 refills | Status: DC

## 2020-11-10 NOTE — Progress Notes (Signed)
Primary Physician/Referring:  Lorenda Ishihara, MD  [name], female    DOB: [birth year], 86 y.o.   MRN: [id number]  Chief Complaint  Patient presents with   Hypertension   Results   Follow-up   HPI:    Rebecca Foster  is a 86 y.o. Asian [place] female with mild hypertension, chronic diastolic heart failure, recurrent pleural effusion in 2020 which is now resolved. Nuclear stress negative for ischemia.  An echocardiogram had revealed hyperdynamic LVEF.  Patient presents for follow-up of hypertension and fatigue.  Last office visit and added losartan/hydrochlorothiazide however patient developed severe renal insufficiency.  Therefore losartan/hydrochlorothiazide was discontinued, thankfully repeat BMP showed the renal function had returned to baseline with discontinuation of the medication.  Patient however during this time was complaining of fatigue.  She now presents for follow-up and states that fatigue has resolved.  She is presently taking diltiazem 120 mg p.o. daily.  She is otherwise feeling well without specific complaints.  She denies dyspnea, orthopnea, PND, leg edema.  Past Medical History:  Diagnosis Date   Hypertension    Past Surgical History:  Procedure Laterality Date   IR THORACENTESIS ASP PLEURAL SPACE W/IMG GUIDE  12/20/2018   lung tap     NO PAST SURGERIES     [place] PLEURAL EFFUSION BILATERAL ([facility] HX)  10/19/2018   Family History  Problem Relation Age of Onset   Heart attack Neg Hx    Stroke Neg Hx    Social History   Tobacco Use   Smoking status: Never   Smokeless tobacco: Never  Substance Use Topics   Alcohol use: No  Widowed  ROS  Review of Systems  Constitutional: Negative for malaise/fatigue (resolved).  Cardiovascular:  Negative for chest pain, dyspnea on exertion and leg swelling.  Gastrointestinal:  Negative for melena.  Objective   Vitals with BMI 11/10/2020 11/10/2020 09/29/2020  Height - 5\' 0"  5\' 0"   Weight - 96 lbs  13 oz 95 lbs  BMI - 18.9 18.55  Systolic 152 160  Diastolic 78 77 75  Pulse 76 73 61    Blood pressure (!) 152/78, pulse 76, temperature 98.2 F (36.8 C), temperature source Temporal, height 5' (1.524 m), weight 96 lb 12.8 oz (43.9 kg), SpO2 98 %. Body mass index is 18.9 kg/m.   Physical Exam Vitals reviewed.  Constitutional:      Comments: She is moderately built  and petite in no acute distress  Neck:     Thyroid: No thyromegaly.     Vascular: No carotid bruit or JVD.  Cardiovascular:     Rate and Rhythm: Normal rate and regular rhythm.     Pulses: Normal pulses and intact distal pulses.     Heart sounds: Normal heart sounds. No murmur heard.   No gallop.  Pulmonary:     Effort: Pulmonary effort is normal.     Breath sounds: Normal breath sounds.  Musculoskeletal:        General: No swelling. Normal range of motion.     Cervical back: Neck supple.  Skin:    General: Skin is warm and dry.   Laboratory examination:   Recent Labs    10/18/20 1714 11/05/20 1512  NA 138 138  K 4.5 4.6  CL 99 103  CO2 24 26  GLUCOSE 94 122*  BUN 29* 19  CREATININE 1.60* 1.19*  CALCIUM 9.4 9.0    CMP Latest Ref Rng & Units 11/05/2020 10/18/2020 12/10/2018  Glucose 70 -  99 mg/dL 122(H) 94 83  BUN 8 - 27 mg/dL 19 29(H) 12  Creatinine 0.57 - 1.00 mg/dL 1.19(H) 1.60(H) 1.15(H)  Sodium 134 - 144 mmol/L 138 138 134  Potassium 3.5 - 5.2 mmol/L 4.6 4.5 5.1  Chloride 96 - 106 mmol/L 103 99 100  CO2 20 - 29 mmol/L 26 24 21   Calcium 8.7 - 10.3 mg/dL 9.0 9.4 9.7  Total Protein 6.5 - 8.1 g/dL - - -  Total Bilirubin 0.3 - 1.2 mg/dL - - -  Alkaline Phos 38 - 126 U/L - - -  AST 15 - 41 U/L - - -  ALT 0 - 44 U/L - - -   CBC Latest Ref Rng & Units 10/20/2018 10/19/2018 03/22/2016  WBC 4.0 - 10.5 K/uL 11.0(H) 10.1 7.3  Hemoglobin 12.0 - 15.0 g/dL 12.9 14.2 13.5  Hematocrit 36.0 - 46.0 % 37.7 42.5 41.3  Platelets 150 - 400 K/uL 403(H) 398 209   BNP    Component Value Date/Time   BNP  110.4 (H) 11/12/2018 1154   BNP 156.5 (H) 10/20/2018 1135   External labs: Cholesterol, total 174.000 m 01/13/2020 HDL 60.000 mg 01/13/2020 LDL 96.000 mg 01/13/2020 Triglycerides 101.000 m 01/13/2020  Hemoglobin 13.000 g/d 01/13/2020  Creatinine, Serum 1.180 mg/ 01/13/2020 Potassium 4.000 mm 01/13/2020 ALT (SGPT) 8.000 U/L 01/13/2020  TSH 1.380 01/13/2020  Allergies   Allergies  Allergen Reactions   Hydrochlorothiazide Other (See Comments)    Renal insufficiency    Amoxicillin Other (See Comments)    Extreme fatigue Did it involve swelling of the face/tongue/throat, SOB, or low BP? No Did it involve sudden or severe rash/hives, skin peeling, or any reaction on the inside of your mouth or nose? No Did you need to seek medical attention at a hospital or doctor's office? No When did it last happen?  86 ish years old     If all above answers are "NO", may proceed with cephalosporin use.   Codeine Other (See Comments)    Higher dose - sensitive, burning sensation     Medications Prior to Visit:   Outpatient Medications Prior to Visit  Medication Sig Dispense Refill   diltiazem (CARDIZEM CD) 120 MG 24 hr capsule Take 1 capsule (120 mg total) by mouth every evening. 30 capsule 2   levothyroxine (SYNTHROID) 25 MCG tablet Take 25 mcg by mouth daily before breakfast.      losartan-hydrochlorothiazide (HYZAAR) 50-12.5 MG tablet Take 1 tablet by mouth every morning. 30 tablet 2   No facility-administered medications prior to visit.   Final Medications at End of Visit    Current Meds  Medication Sig   diltiazem (CARDIZEM CD) 120 MG 24 hr capsule Take 1 capsule (120 mg total) by mouth every evening.   hydrALAZINE (APRESOLINE) 25 MG tablet Take 1 tablet (25 mg total) by mouth 3 (three) times daily. Take an additional tablet (25 mg) as needed if blood pressure >150/90 mmHg.   levothyroxine (SYNTHROID) 25 MCG tablet Take 25 mcg by mouth daily before breakfast.    [DISCONTINUED]  losartan-hydrochlorothiazide (HYZAAR) 50-12.5 MG tablet Take 1 tablet by mouth every morning.   Radiology:   CXR PA/LAT 11/01/2018:  Probable underlying COPD with bibasilar pleural effusions and atelectasis, increased on RIGHT and slightly decreased on LEFT since prior exam.  CXR PA/LAT 12/20/2018:  The heart size and mediastinal contours are within normal limits. Both lungs are clear. No pneumothorax or pleural effusion is noted. The visualized skeletal structures are unremarkable. IMPRESSION: No active  disease.  Cardiac Studies:   Echocardiogram 10/20/2018:  1. Left ventricular ejection fraction, by visual estimation, is 65 to 70%. The left ventricle has hyperdynamic function. Decreased left ventricular size. There is mildly increased left ventricular hypertrophy.  2. Left ventricular diastolic Doppler parameters are consistent with impaired relaxation pattern of LV diastolic filling. 3. 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 Trace mitral valve regurgitation. 4. The tricuspid valve is grossly normal. Tricuspid valve regurgitation is mild.  The tricuspid regurgitant velocity is 3.34 m/s, and with an assumed right atrial pressure of 3 mmHg, the estimated right ventricular systolic pressure is moderately elevated at 47.6 mmHg. 5. Small pericardial effusion with signs of organization. The pericardial effusion is predominantly anterior to the right ventricle and lateral to the left ventricle, also trivial amount posteriorly. Mitral outflow pattern does not suggest hemodynamic significance.  Left pleural effusion is noted.  Lexiscan Tetrofosmin Stress Test  12/23/2018: Nondiagnostic ECG stress. Normal myocardial perfusion. Stress LV EF: 59%.   Low risk study. No previous exam available for comparison.  EKG  EKG9/28/2022: Marked sinus bradycardia at the rate of 54 bpm, incomplete right bundle branch block, nonspecific T abnormality.  No significant change from prior EKG  11/01/2018, sinus bradycardia new compared to heart rate of 98 bpm.  Assessment     ICD-10-CM   1. Essential hypertension  I10     2. Chronic diastolic heart failure (HCC)  I50.32     3. Stage 3a chronic kidney disease (HCC)  N18.31        Recommendations:   Rebecca Foster  is a 86 y.o. Asian [place] female with mild hypertension, chronic diastolic heart failure, recurrent pleural effusion in 2020 which is now resolved. Nuclear stress negative for ischemia.  An echocardiogram had revealed hyperdynamic LVEF.  Patient presents for follow-up of hypertension and fatigue.  Patient's fatigue has resolved since last office visit and stopping losartan/hydrochlorothiazide.  Patient's renal function has returned to baseline.  Will avoid use of diuretics for blood pressure control.  Patient in the past has been on diltiazem 180 mg, however this was reduced due to bradycardia on 09/29/2020.  Her blood pressure is uncontrolled.  We will continue diltiazem 120 mg p.o. daily and add hydralazine 25 mg p.o. 3 times daily with additional doses as needed for systolic blood pressure [id number] mmHg.  Patient and her daughter who is present at bedside verbalized understanding and agreement with this plan.  Patient and her family will monitor blood pressure on a regular basis at home and notify our office if it remains >140/90 mmHg consistently.  Follow-up in 6 months, sooner if needed, for hypertension, chronic diastolic heart failure.  Patient was seen in collaboration with Dr. Einar Gip. He also reviewed patient's chart and examined the patient. Dr. Einar Gip is in agreement of the plan.    Alethia Berthold, PA-C 11/10/2020, 4:17 PM Office: [phone]

## 2020-11-15 ENCOUNTER — Telehealth: Payer: Self-pay | Admitting: Student

## 2020-11-15 DIAGNOSIS — I10 Essential (primary) hypertension: Secondary | ICD-10-CM | POA: Diagnosis not present

## 2020-11-15 NOTE — Telephone Encounter (Signed)
From pt

## 2020-11-15 NOTE — Telephone Encounter (Signed)
ON-CALL CARDIOLOGY 11/13/2020  Patient's name: Rebecca Foster.   MRN: [id number].    DOB: 25-Feb-1934 Primary care provider: Lorenda Ishihara, MD. Primary cardiologist: D.r Karle Plumber  Interaction regarding this patient's care today: Patient's daughter called with concerns that patient had started hydralazine which was recently prescribedAt visit 3 days ago and patient was having concerning side effects.  Patient took first dose of hydralazine this morning and felt fatigued, she then took second dose of hydralazine 25 mg this afternoon and fatigue got significantly worse she developed a rash on her cheeks.  Patient denies shortness of breath, chest pain, syncope or near syncope.  Impression:   ICD-10-CM   1. Essential hypertension  I10       No orders of the defined types were placed in this encounter.   No orders of the defined types were placed in this encounter.   Recommendations: Advised patient to stop hydralazine and continue to monitor home blood pressure readings.  Could consider increasing amlodipine to 180 mg if patient's blood pressure remains consistently >140/80 mmHg.  Patient's daughter verbalized understanding and agreement with the plan.  Telephone encounter total time: 8 minutes     Rayford Halsted, PA-C 11/15/2020, 9:27 AM Office: [phone]

## 2021-01-24 ENCOUNTER — Other Ambulatory Visit: Payer: Self-pay | Admitting: Cardiology

## 2021-02-02 IMAGING — CR DG CHEST 2V
2 series · 2 of 2 positions shown · non-contrast
Comparison: Chest x-ray 10/14/2018.

CLINICAL DATA: 84-year-old female with history of shortness of
breath, cough and chest pain.

EXAM:
CHEST - 2 VIEW

[w chest pa]
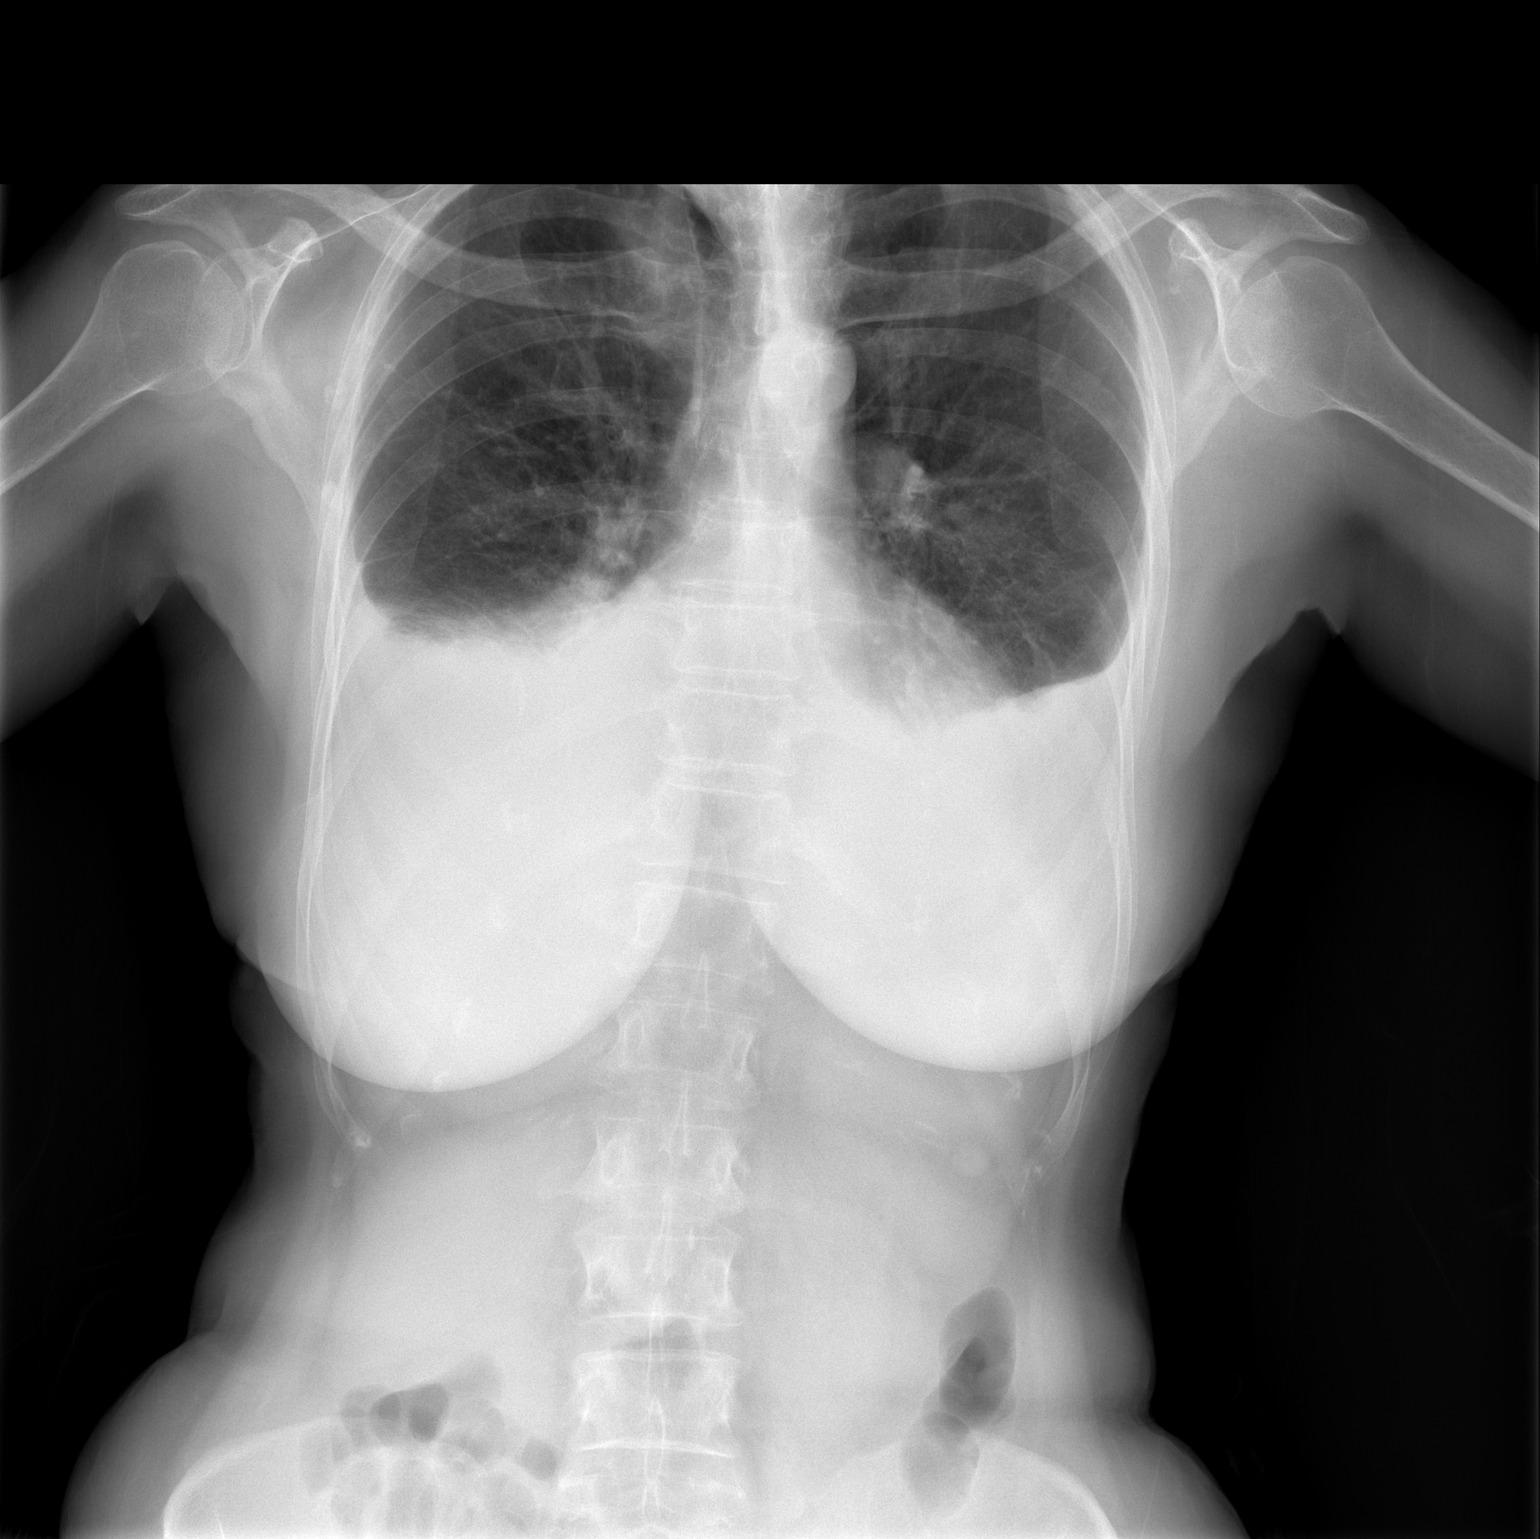

[w chest lat]
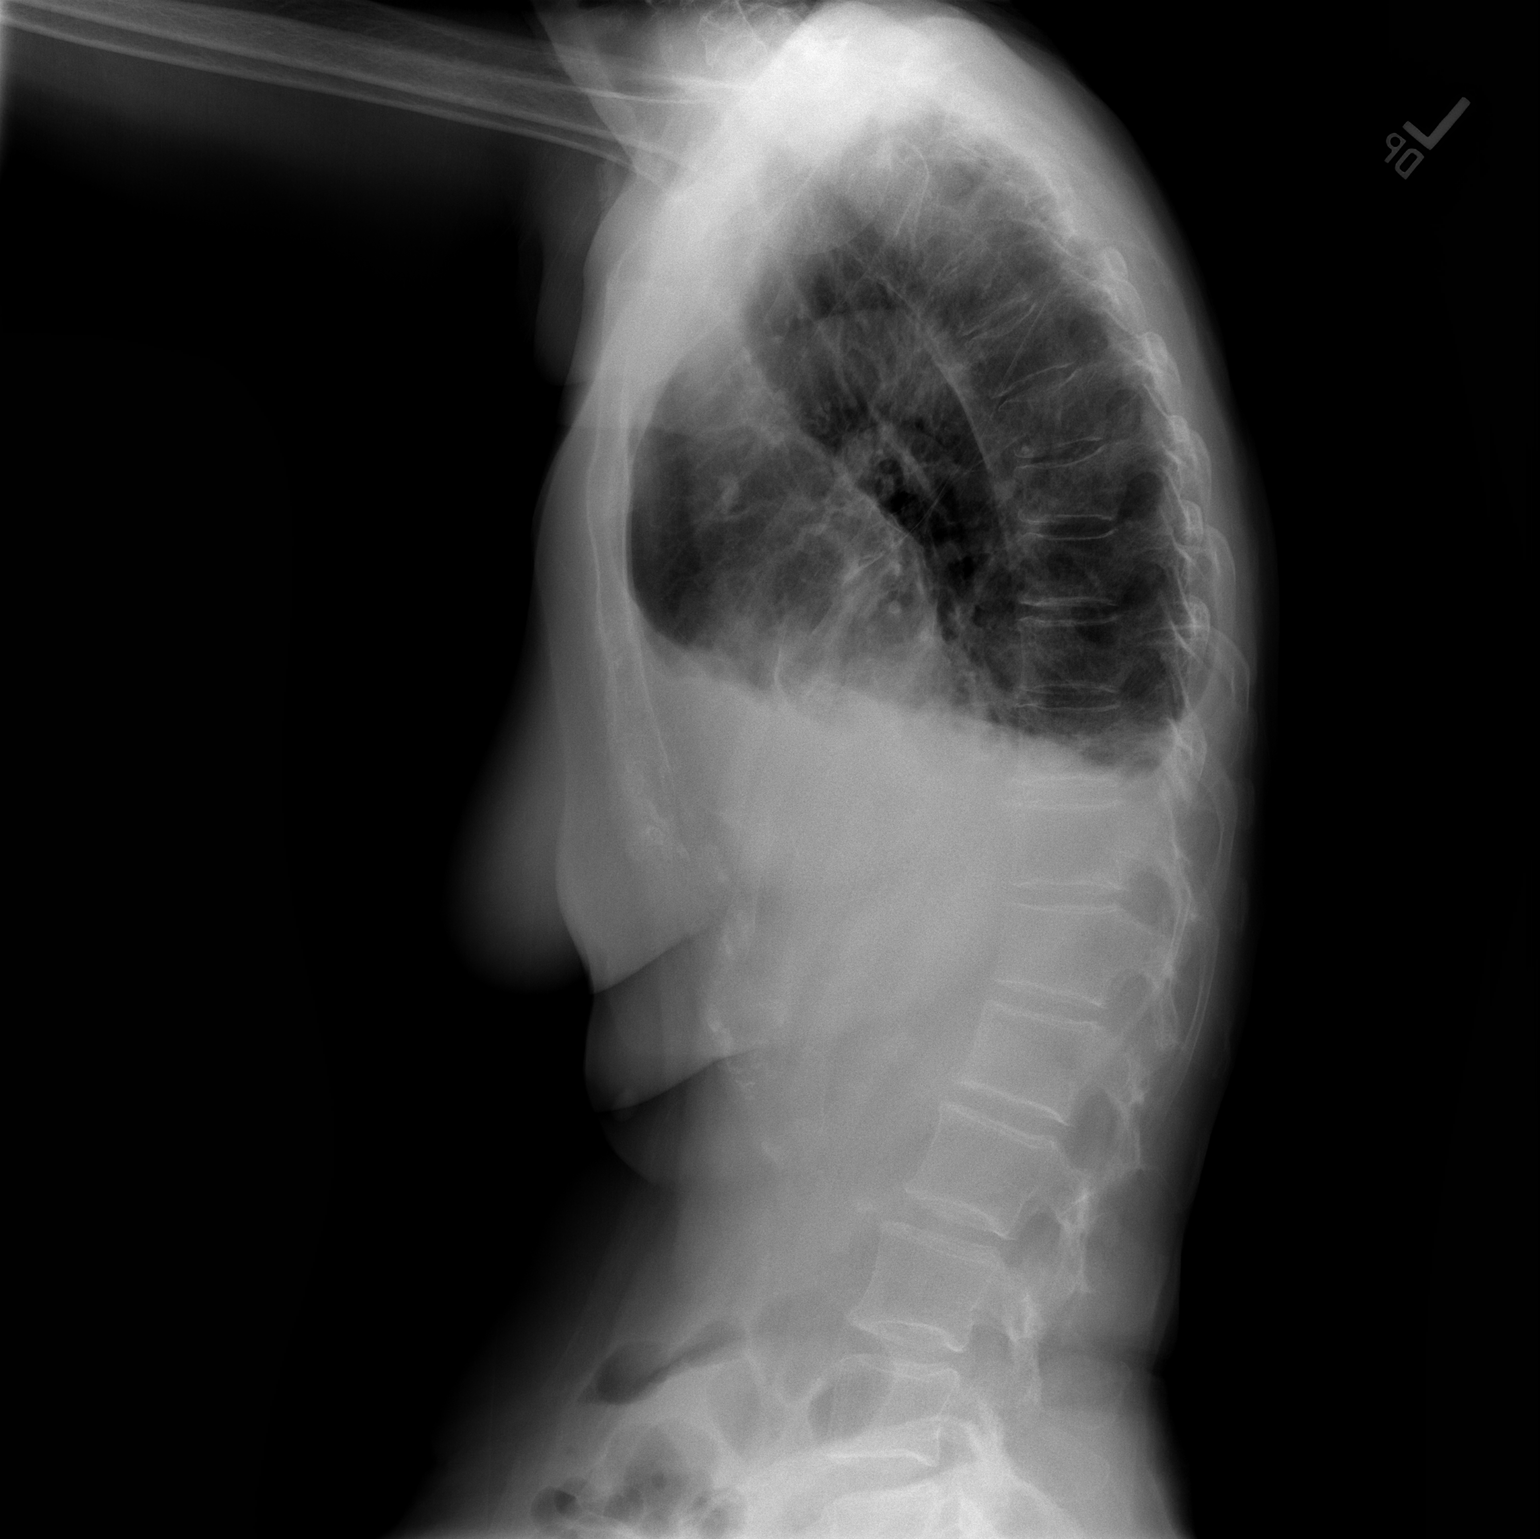

[2 of 2 positions shown; findings below may reference images not displayed]

FINDINGS: Moderate bilateral pleural effusions lying dependently with
bibasilar opacities which may reflect areas of atelectasis and/or
consolidation period mid to upper lungs appear clear. No definite
suspicious appearing pulmonary nodules or masses. No pneumothorax.
No evidence of pulmonary edema. Cardiac silhouette is partially
obscured. Upper mediastinal contours are within normal limits.
IMPRESSION: 1. Moderate bilateral pleural effusions lying dependently with
bibasilar areas of atelectasis and/or consolidation.

## 2021-02-07 IMAGING — CT CT ANGIO CHEST
2 of 7 series · 18 of 46 positions shown · IV contrast (APPLIED)
Comparison: Radiograph same day, CT March 23, 2016

CLINICAL DATA: Chest pain, shortness of breath

EXAM:
CT ANGIOGRAPHY CHEST WITH CONTRAST
TECHNIQUE: Multidetector CT imaging of the chest was performed using the
standard protocol during bolus administration of intravenous
contrast. Multiplanar CT image reconstructions and MIPs were
obtained to evaluate the vascular anatomy.
CONTRAST:  75mL OMNIPAQUE IOHEXOL 350 MG/ML SOLN

[Series 8: thins · axial · 0.60mm/px · z∈[+953,+1153]mm · 15 of 322 slices shown]
[im 18/322  lung]
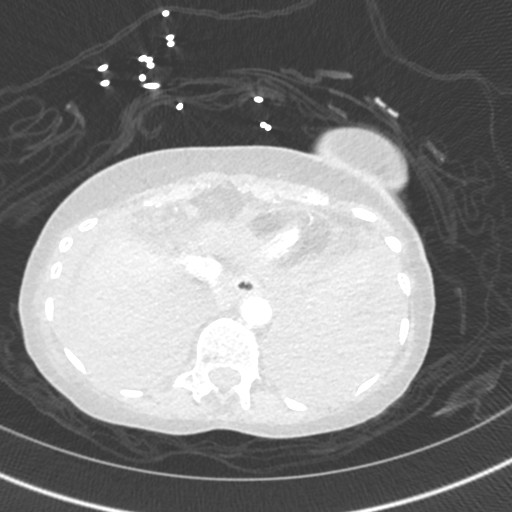
[im 36/322  soft-tissue]
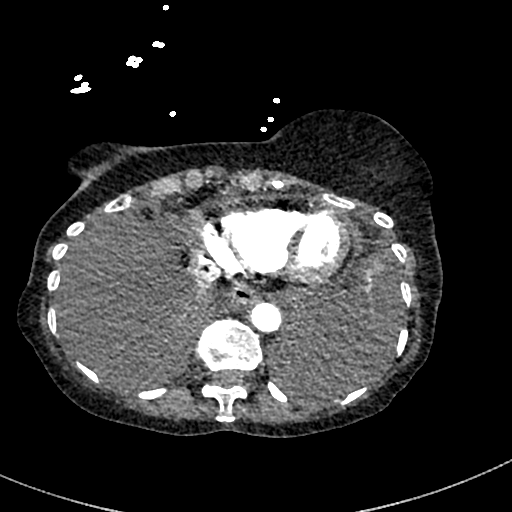
[im 54/322  lung]
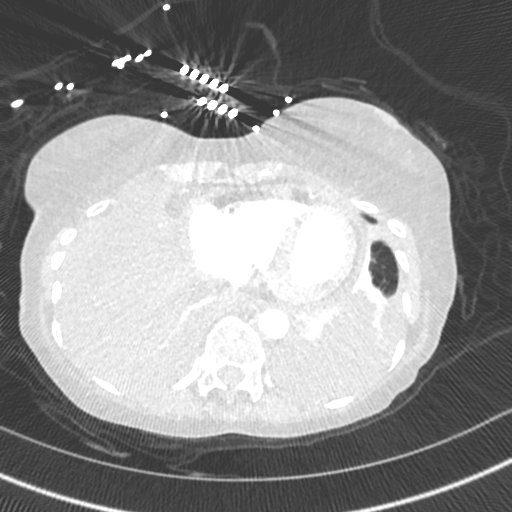
[im 72/322  soft-tissue]
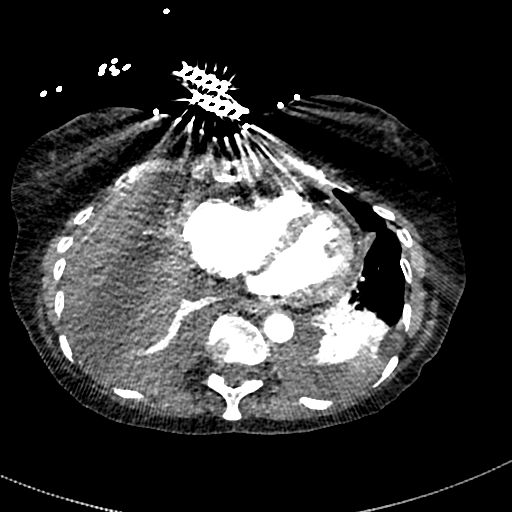
[im 108/322  lung]
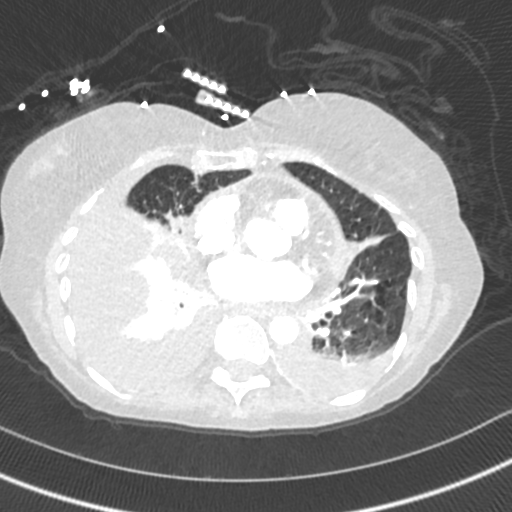
[im 125/322  soft-tissue]
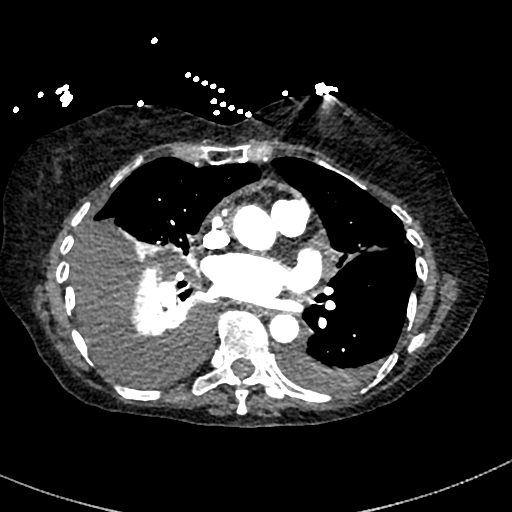
[im 143/322  lung]
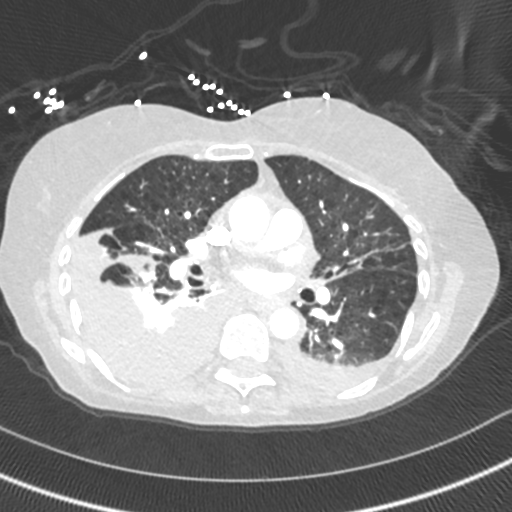
[im 161/322  soft-tissue]
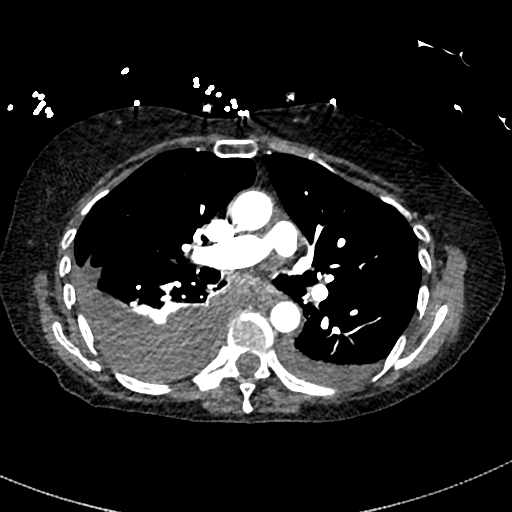
[im 179/322  lung]
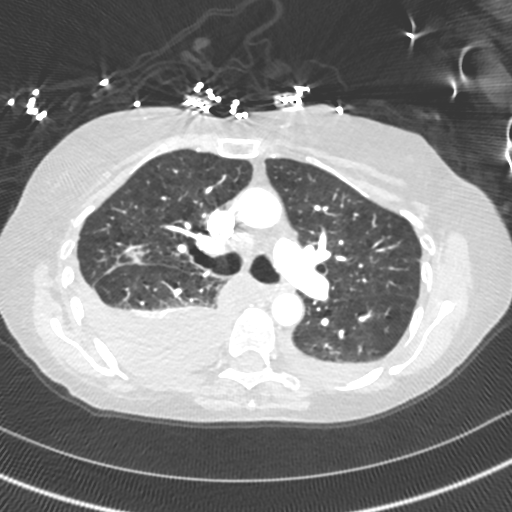
[im 197/322  soft-tissue]
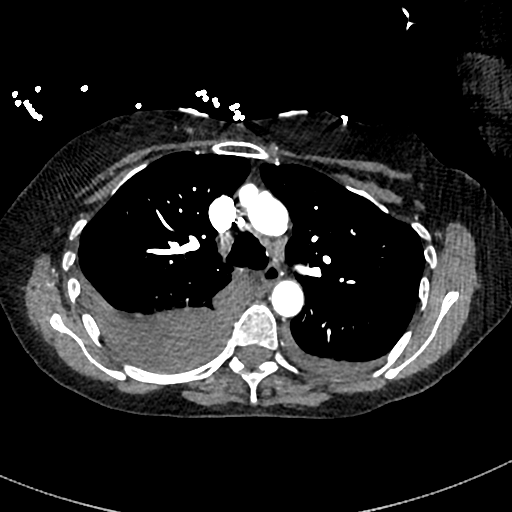
[im 215/322  lung]
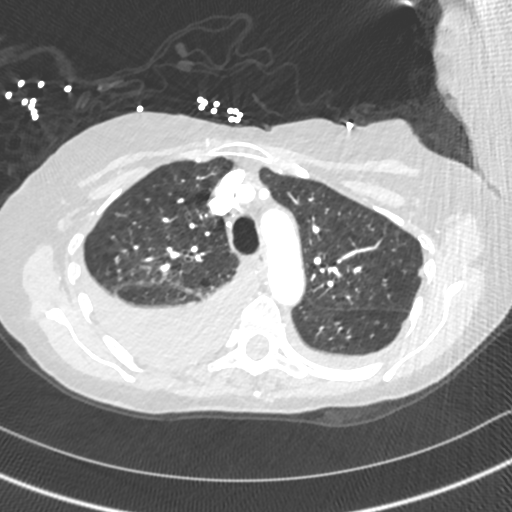
[im 250/322  soft-tissue]
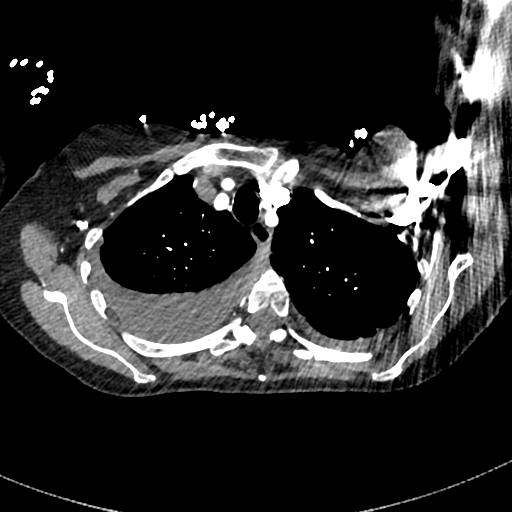
[im 268/322  lung]
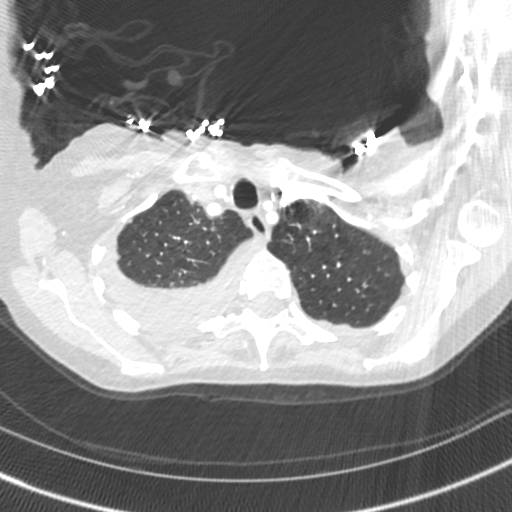
[im 286/322  soft-tissue]
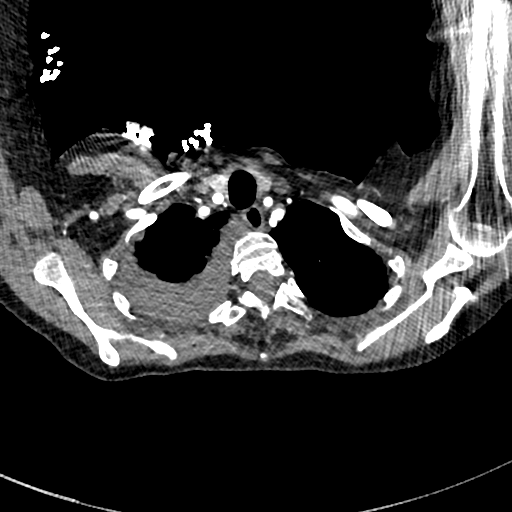
[im 304/322  lung]
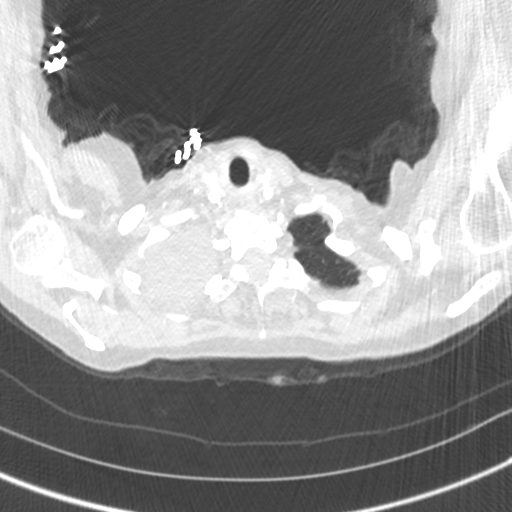

[Series 10: cor · coronal · 0.44mm/px · 3 of 97 slices shown]
[im 25/97  soft-tissue]
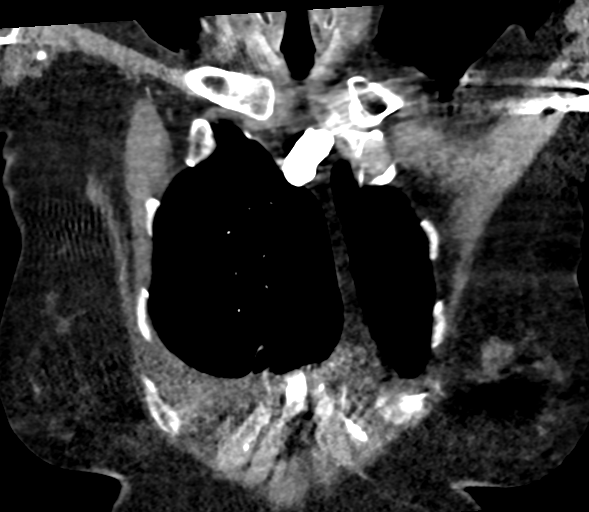
[im 49/97  soft-tissue]
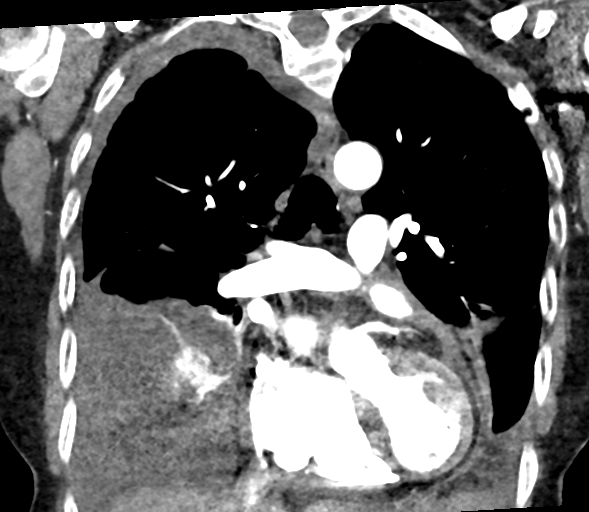
[im 73/97  soft-tissue]
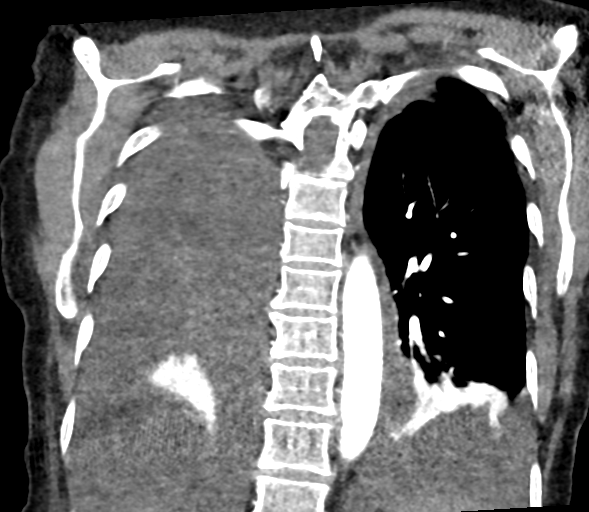

[18 of 46 positions shown; findings below may reference images not displayed]

FINDINGS: Cardiovascular: There is a optimal opacification of the pulmonary
arteries. There is no central,segmental, or subsegmental filling
defects within the pulmonary arteries. There is mild cardiomegaly. A
small pericardial effusion is seen. There is normal three-vessel
brachiocephalic anatomy without proximal stenosis. The thoracic
aorta is normal in appearance.

Mediastinum/Nodes: No hilar, mediastinal, or axillary adenopathy.
Thyroid gland, trachea, and esophagus demonstrate no significant
findings.

Lungs/Pleura: A large right and small to moderate left pleural
effusion is seen. There is adjacent area of consolidation seen at
the right lung base and left lung base. Streaky atelectasis seen at
the anterior left lung base.

Upper Abdomen: No acute abnormalities present in the visualized
portions of the upper abdomen.

Musculoskeletal: No chest wall abnormality. No acute or significant
osseous findings.

Review of the MIP images confirms the above findings.
IMPRESSION: 1. Large right and small to moderate left pleural effusion.
2. Adjacent bi basilar areas of consolidation which could be due to
compressive atelectasis versus multifocal infectious etiology.
3. Small pericardial effusion.
4. Cardiomegaly.
5. No central, segmental, or subsegmental pulmonary embolism.

## 2021-02-08 IMAGING — CR DG CHEST 1V
1 series · 1 of 1 positions shown · non-contrast
Comparison: October 19, 2018

CLINICAL DATA: Status post right thoracentesis

EXAM:
CHEST  1 VIEW

[chest ap]
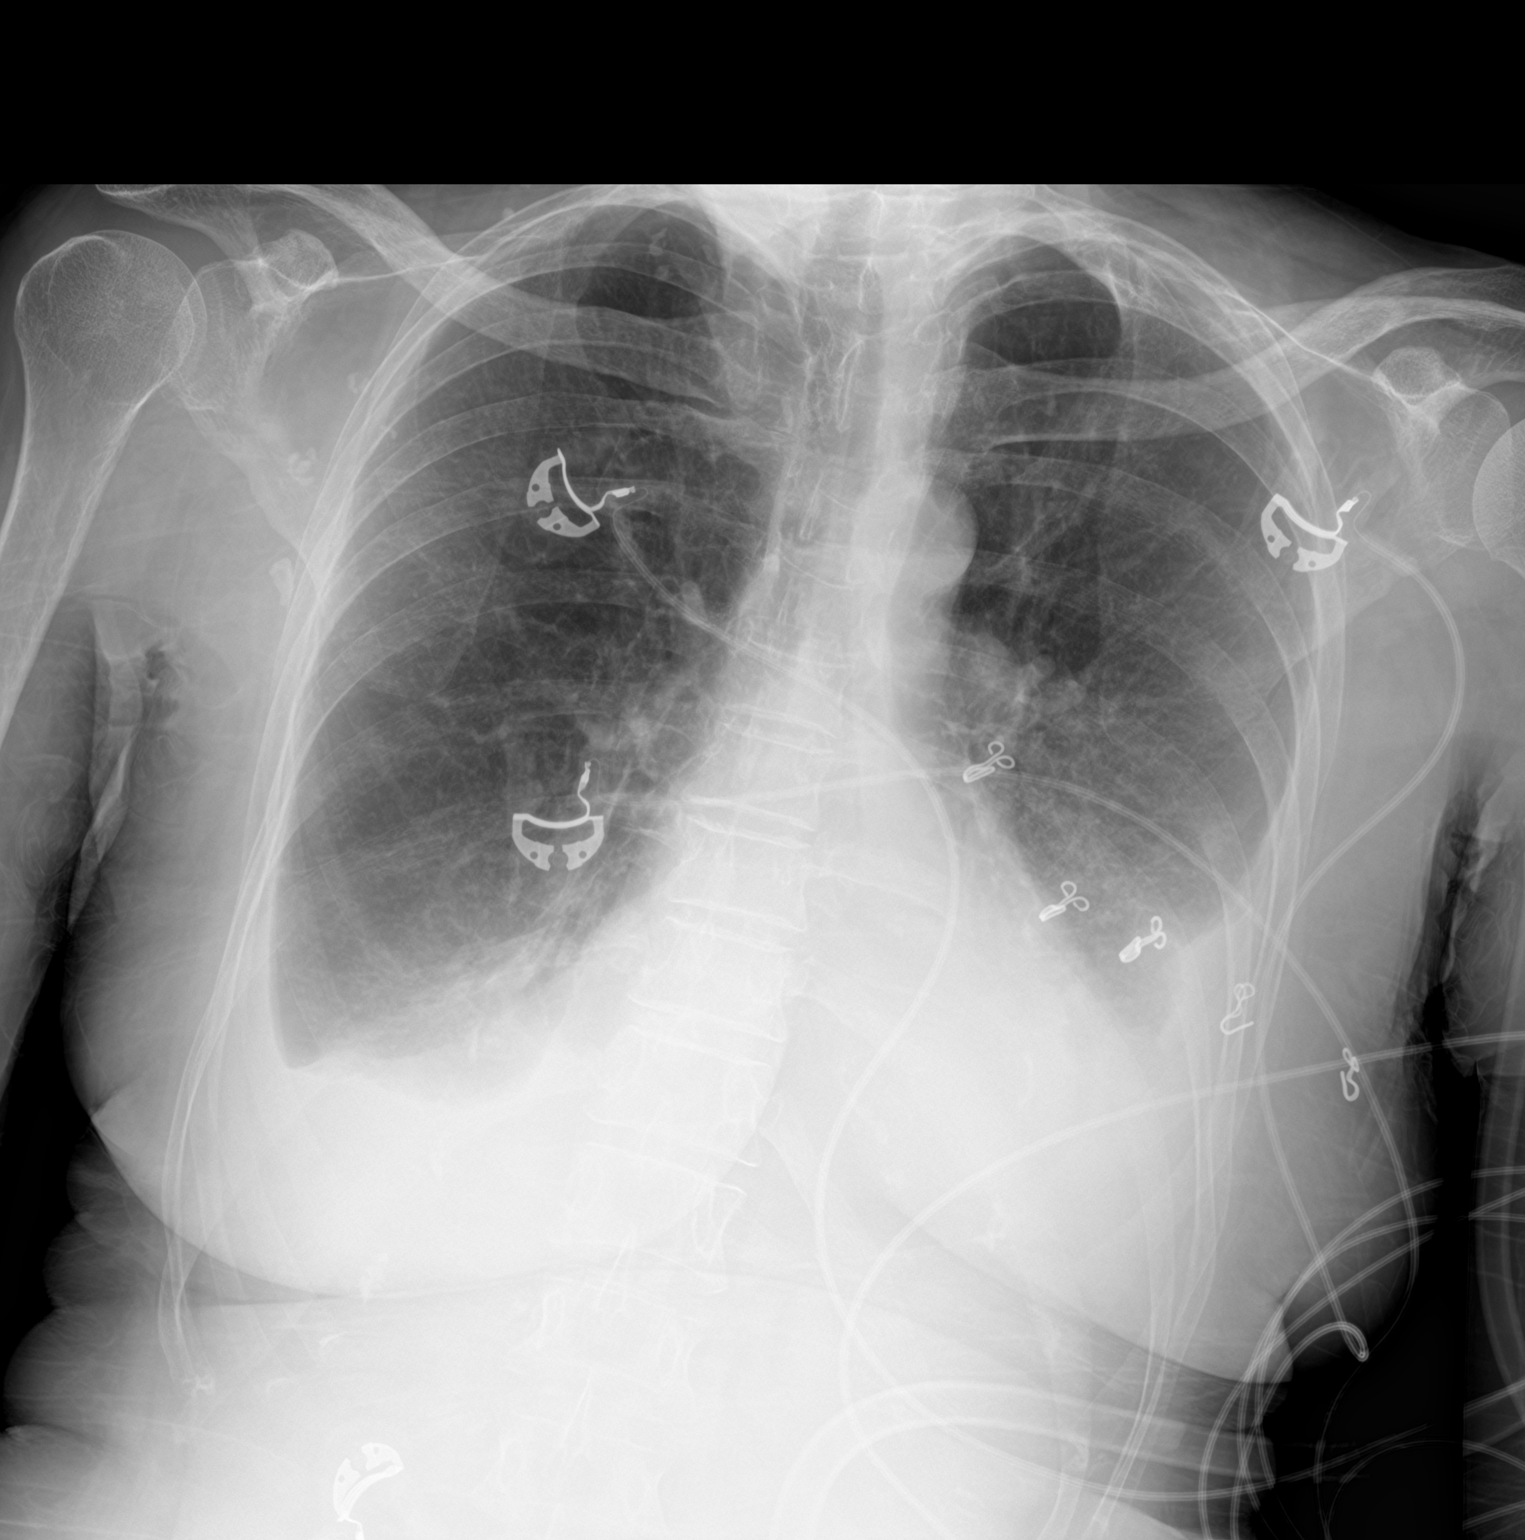

[1 of 1 positions shown; findings below may reference images not displayed]

FINDINGS: No pneumothorax after thoracentesis. Bilateral pleural effusions
remain, smaller on the right and stable on the left. Opacity
underlies the effusions, probably atelectasis. No other changes.
IMPRESSION: The right-sided pleural effusion is smaller in the interval.
Left-sided pleural effusion is stable. No pneumothorax after
thoracentesis.

## 2021-03-10 DIAGNOSIS — H903 Sensorineural hearing loss, bilateral: Secondary | ICD-10-CM | POA: Diagnosis not present

## 2021-03-25 ENCOUNTER — Other Ambulatory Visit: Payer: Self-pay | Admitting: Internal Medicine

## 2021-03-25 ENCOUNTER — Ambulatory Visit
Admission: RE | Admit: 2021-03-25 | Discharge: 2021-03-25 | Disposition: A | Discharge status: Home / Self Care | Payer: Medicare Other | Source: Ambulatory Visit | Attending: Internal Medicine | Admitting: Internal Medicine

## 2021-03-25 DIAGNOSIS — R400 Somnolence: Secondary | ICD-10-CM | POA: Diagnosis not present

## 2021-03-25 DIAGNOSIS — W19XXXA Unspecified fall, initial encounter: Secondary | ICD-10-CM | POA: Diagnosis not present

## 2021-03-25 DIAGNOSIS — S0990XA Unspecified injury of head, initial encounter: Secondary | ICD-10-CM

## 2021-03-25 DIAGNOSIS — E039 Hypothyroidism, unspecified: Secondary | ICD-10-CM | POA: Diagnosis not present

## 2021-03-25 DIAGNOSIS — I10 Essential (primary) hypertension: Secondary | ICD-10-CM | POA: Diagnosis not present

## 2021-03-25 DIAGNOSIS — R519 Headache, unspecified: Secondary | ICD-10-CM | POA: Diagnosis not present

## 2021-03-25 DIAGNOSIS — N1831 Chronic kidney disease, stage 3a: Secondary | ICD-10-CM | POA: Diagnosis not present

## 2021-04-14 DIAGNOSIS — J0100 Acute maxillary sinusitis, unspecified: Secondary | ICD-10-CM | POA: Diagnosis not present

## 2021-04-14 DIAGNOSIS — J069 Acute upper respiratory infection, unspecified: Secondary | ICD-10-CM | POA: Diagnosis not present

## 2021-04-14 DIAGNOSIS — I10 Essential (primary) hypertension: Secondary | ICD-10-CM | POA: Diagnosis not present

## 2021-05-11 ENCOUNTER — Ambulatory Visit: Payer: Medicare Other | Admitting: Cardiology

## 2021-05-11 ENCOUNTER — Encounter: Payer: Self-pay | Admitting: Cardiology

## 2021-05-11 VITALS — BP 172/78 | HR 77 | Temp 98.7°F | Resp 16 | Ht 60.0 in | Wt 95.0 lb

## 2021-05-11 DIAGNOSIS — I10 Essential (primary) hypertension: Secondary | ICD-10-CM

## 2021-05-11 DIAGNOSIS — I5032 Chronic diastolic (congestive) heart failure: Secondary | ICD-10-CM

## 2021-05-11 DIAGNOSIS — R030 Elevated blood-pressure reading, without diagnosis of hypertension: Secondary | ICD-10-CM

## 2021-05-11 DIAGNOSIS — N1831 Chronic kidney disease, stage 3a: Secondary | ICD-10-CM

## 2021-05-11 NOTE — Progress Notes (Signed)
? ?Primary Physician/Referring:  Leeroy Cha, MD ? ?[name], female    DOB: 1934/11/06, 87 y.o.   MRN: [id number] ? ?Chief Complaint  ?Patient presents with  ? Hypertension  ? Congestive Heart Failure  ? Follow-up  ?  6 months  ? ?HPI:   ? ?Rebecca Foster  is a 87 y.o. This is 27-monthoffice visit, accompanied by her daughter.  She has not had any further worsening dyspnea.  Denies any leg edema.  She is walking on a daily basis without any limitations.   ? ?States that she has gone back to doing all of physical activity without limitation.  She has not had any further dyspnea, no PND or orthopnea or leg edema. ? ?Past Medical History:  ?Diagnosis Date  ? Hypertension   ? ?Past Surgical History:  ?Procedure Laterality Date  ? IR THORACENTESIS ASP PLEURAL SPACE W/IMG GUIDE  12/20/2018  ? lung tap    ? NO PAST SURGERIES    ? [place] E[place]AL [place]  10/19/2018  ? ? ?ROS  ?Review of Systems  ?Cardiovascular:  Negative for chest pain, dyspnea on exertion and leg swelling.  ?Gastrointestinal:  Negative for melena.  ?Objective  ? ? ?  05/11/2021  ?  3:12 PM 11/10/2020  ?  3:29 PM 11/10/2020  ?  3:25 PM  ?Vitals with BMI  ?Height 5' 0"   5' 0"   ?Weight 95 lbs  96 lbs 13 oz  ?BMI 18.55  18.9  ?Systolic [id number] ?Diastolic 78 78 77  ?Pulse 77 76 73  ?  ?Blood pressure (!) 172/78, pulse 77, temperature 98.7 ?F (37.1 ?C), temperature source Temporal, resp. rate 16, height 5' (1.524 m), weight 95 lb (43.1 kg), SpO2 95 %. Body mass index is 18.55 kg/m?. ?  ?Physical Exam ?Neck:  ?   Vascular: No JVD.  ?Cardiovascular:  ?   Rate and Rhythm: Normal rate and regular rhythm.  ?   Pulses: Intact distal pulses.  ?   Heart sounds: Normal heart sounds. No murmur heard. ?  No gallop.  ?Pulmonary:  ?   Effort: Pulmonary effort is normal.  ?   Breath sounds: Normal breath sounds.  ?Abdominal:  ?   General: Bowel sounds are normal.  ?   Palpations: Abdomen is soft.  ?Musculoskeletal:  ?   Right  lower leg: No edema.  ?   Left lower leg: No edema.  ? ?Laboratory examination:  ? ?Recent Labs  ?  10/18/20 ?1714 11/05/20 ?1512  ?NA 138 138  ?K 4.5 4.6  ?CL 99 103  ?CO2 24 26  ?GLUCOSE 94 122*  ?BUN 29* 19  ?CREATININE 1.60* 1.19*  ?CALCIUM 9.4 9.0  ? ? ?External labs: ?Labs 03/25/2021: ? ?TSH normal at 1.67. ? ?Hb 11.7/HCT 35.1, platelets 234, normal indicis. ? ?BUN 16, creatinine 1.02, EGFR 54 mL, potassium 4.2, LFTs normal. ? ?Total cholesterol 182, triglycerides 93, HDL 72, LDL 94. ? ?Medications and allergies  ? ?Allergies  ?Allergen Reactions  ? Hydrochlorothiazide Other (See Comments)  ?  Renal insufficiency   ? Losartan Other (See Comments)  ?  Acute renal failure  ? Amoxicillin Other (See Comments)  ?  Extreme fatigue ?Did it involve swelling of the face/tongue/throat, SOB, or low BP? No ?Did it involve sudden or severe rash/hives, skin peeling, or any reaction on the inside of your mouth or nose? No ?Did you need to seek medical attention at a hospital or doctor's office?  No ?When did it last happen?  79 ish years old     ?If all above answers are ?NO?, may proceed with cephalosporin use.  ? Codeine Other (See Comments)  ?  Higher dose - sensitive, burning sensation   ?  ?Current Outpatient Medications  ?Medication Instructions  ? diltiazem (CARDIZEM CD) 120 mg, Oral, Every evening  ? levothyroxine (SYNTHROID) 25 mcg, Oral, Daily before breakfast  ? ? ?Radiology:  ? ?CXR PA/LAT 11/01/2018:  ?Probable underlying COPD with bibasilar pleural effusions and ?atelectasis, increased on RIGHT and slightly decreased on LEFT since prior exam. ? ?CXR PA/LAT 12/20/2018:  ?The heart size and mediastinal contours are within normal limits. ?Both lungs are clear. No pneumothorax or pleural effusion is noted. ?The visualized skeletal structures are unremarkable. ?IMPRESSION: ?No active disease. ? ?Cardiac Studies:  ? ?Echocardiogram 10/20/2018: ? 1. Left ventricular ejection fraction, by visual estimation, is 65 to 70%.  The left ventricle has hyperdynamic function. Decreased left ventricular size. There is mildly increased left ventricular hypertrophy. ? 2. Left ventricular diastolic Doppler parameters are consistent with impaired relaxation pattern of LV diastolic filling. ?3. 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 Trace mitral valve regurgitation. ?4. The tricuspid valve is grossly normal. Tricuspid valve regurgitation is mild.  The tricuspid regurgitant velocity is 3.34 m/s, and with an assumed right atrial pressure of 3 mmHg, the estimated right ventricular systolic pressure is moderately elevated at 47.6 mmHg. ?5. Small pericardial effusion with signs of organization. The pericardial effusion is predominantly anterior to the right ventricle and lateral to the left ventricle, also trivial amount posteriorly. Mitral outflow pattern does not suggest hemodynamic significance.  Left pleural effusion is noted. ? ?Lexiscan Tetrofosmin Stress Test  12/23/2018: ?Nondiagnostic ECG stress. ?Normal myocardial perfusion. Stress LV EF: 59%.   ?Low risk study. No previous exam available for comparison. ? ?EKG:  ? ?EKG Marked sinus bradycardia at the rate of 54 bpm, incomplete right bundle branch block, nonspecific T abnormality.  No significant change from prior EKG 11/01/2018, sinus bradycardia new compared to heart rate of 98 bpm. ? ?Assessment  ? ?  ICD-10-CM   ?1. Chronic diastolic heart failure (HCC)  I50.32 EKG 12-Lead  ?  ?2. Essential hypertension  I10   ?  ?3. Stage 3a chronic kidney disease (HCC)  N18.31   ?  ?4. Elevated BP without diagnosis of hypertension  R03.0   ?  ?   ?Recommendations:  ? ?Rebecca Foster  is a 87 y.o. [place] female with mild hypertension, chronic diastolic heart failure, recurrent pleural effusion in 2020 which is now resolved. Nuclear stress negative for ischemia.  An echocardiogram had revealed hyperdynamic LVEF. ? ?This is 62-monthoffice visit, accompanied by her daughter.  She  has not had any further worsening dyspnea.  Denies any leg edema.  She is walking on a daily basis without any limitations.  Blood pressure is well controlled at home, patient states that she gets extremely anxious coming to the doctor's office.  No changes in the medications were done by me today. ? ?I will see her back on a as needed basis.  I reviewed her labs, CBC has remained stable, lipids are normal and renal function is back to baseline at stage IIIa chronic kidney disease after discontinuation of losartan.  She has not had any reaccumulation of pleural effusion.  ? ? ? ?JAdrian Prows MD, [facility]?05/11/2021, 4:00 PM ?Office: [phone]?Fax: [phone]?Pager: [phone]  ?

## 2021-05-23 ENCOUNTER — Other Ambulatory Visit: Payer: Self-pay

## 2021-05-23 MED ORDER — DILTIAZEM HCL ER COATED BEADS 120 MG PO CP24: 120.0000 mg | ORAL_CAPSULE | Freq: Every evening | ORAL | 2 refills | Status: DC

## 2021-05-24 ENCOUNTER — Encounter: Payer: Self-pay | Admitting: Cardiology

## 2021-05-24 ENCOUNTER — Telehealth: Payer: Self-pay

## 2021-05-24 DIAGNOSIS — I10 Essential (primary) hypertension: Secondary | ICD-10-CM

## 2021-05-24 MED ORDER — DILTIAZEM HCL ER COATED BEADS 180 MG PO CP24: 180.0000 mg | ORAL_CAPSULE | Freq: Every evening | ORAL | 3 refills | Status: DC

## 2021-05-24 NOTE — Telephone Encounter (Signed)
ICD-10-CM   1. Essential hypertension  I10 diltiazem (CARDIZEM CD) 180 MG 24 hr capsule      Meds ordered this encounter  Medications   diltiazem (CARDIZEM CD) 180 MG 24 hr capsule    Sig: Take 1 capsule (180 mg total) by mouth every evening.    Dispense:  90 capsule    Refill:  3    Medications Discontinued During This Encounter  Medication Reason   diltiazem (CARDIZEM CD) 120 MG 24 hr capsule

## 2021-05-25 DIAGNOSIS — E039 Hypothyroidism, unspecified: Secondary | ICD-10-CM | POA: Diagnosis not present

## 2021-05-25 DIAGNOSIS — I10 Essential (primary) hypertension: Secondary | ICD-10-CM | POA: Diagnosis not present

## 2021-05-25 DIAGNOSIS — E441 Mild protein-calorie malnutrition: Secondary | ICD-10-CM | POA: Diagnosis not present

## 2021-05-25 DIAGNOSIS — Z1331 Encounter for screening for depression: Secondary | ICD-10-CM | POA: Diagnosis not present

## 2021-05-25 DIAGNOSIS — M810 Age-related osteoporosis without current pathological fracture: Secondary | ICD-10-CM | POA: Diagnosis not present

## 2021-05-25 DIAGNOSIS — H903 Sensorineural hearing loss, bilateral: Secondary | ICD-10-CM | POA: Diagnosis not present

## 2021-05-25 DIAGNOSIS — J301 Allergic rhinitis due to pollen: Secondary | ICD-10-CM | POA: Diagnosis not present

## 2021-05-25 DIAGNOSIS — R2689 Other abnormalities of gait and mobility: Secondary | ICD-10-CM | POA: Diagnosis not present

## 2021-05-25 DIAGNOSIS — I5032 Chronic diastolic (congestive) heart failure: Secondary | ICD-10-CM | POA: Diagnosis not present

## 2021-05-25 DIAGNOSIS — N1831 Chronic kidney disease, stage 3a: Secondary | ICD-10-CM | POA: Diagnosis not present

## 2021-05-25 DIAGNOSIS — Z0000 Encounter for general adult medical examination without abnormal findings: Secondary | ICD-10-CM | POA: Diagnosis not present

## 2021-06-02 DIAGNOSIS — Z23 Encounter for immunization: Secondary | ICD-10-CM | POA: Diagnosis not present

## 2021-08-24 DIAGNOSIS — E441 Mild protein-calorie malnutrition: Secondary | ICD-10-CM | POA: Diagnosis not present

## 2021-08-24 DIAGNOSIS — I5032 Chronic diastolic (congestive) heart failure: Secondary | ICD-10-CM | POA: Diagnosis not present

## 2021-08-24 DIAGNOSIS — I10 Essential (primary) hypertension: Secondary | ICD-10-CM | POA: Diagnosis not present

## 2021-08-24 DIAGNOSIS — L239 Allergic contact dermatitis, unspecified cause: Secondary | ICD-10-CM | POA: Diagnosis not present

## 2021-08-24 DIAGNOSIS — N1831 Chronic kidney disease, stage 3a: Secondary | ICD-10-CM | POA: Diagnosis not present

## 2021-11-21 ENCOUNTER — Other Ambulatory Visit: Payer: Self-pay

## 2021-11-21 DIAGNOSIS — I10 Essential (primary) hypertension: Secondary | ICD-10-CM

## 2021-11-21 MED ORDER — DILTIAZEM HCL ER COATED BEADS 180 MG PO CP24: 180.0000 mg | ORAL_CAPSULE | Freq: Every evening | ORAL | 3 refills | Status: DC

## 2021-11-29 ENCOUNTER — Encounter: Payer: Self-pay | Admitting: Cardiology

## 2021-11-29 ENCOUNTER — Ambulatory Visit: Payer: Medicare Other | Admitting: Cardiology

## 2021-11-29 VITALS — BP 168/82 | HR 82 | Ht 60.0 in | Wt 102.0 lb

## 2021-11-29 DIAGNOSIS — I5032 Chronic diastolic (congestive) heart failure: Secondary | ICD-10-CM | POA: Diagnosis not present

## 2021-11-29 DIAGNOSIS — I10 Essential (primary) hypertension: Secondary | ICD-10-CM | POA: Diagnosis not present

## 2021-11-29 DIAGNOSIS — Z5181 Encounter for therapeutic drug level monitoring: Secondary | ICD-10-CM | POA: Diagnosis not present

## 2021-11-29 DIAGNOSIS — Z7901 Long term (current) use of anticoagulants: Secondary | ICD-10-CM | POA: Diagnosis not present

## 2021-11-29 MED ORDER — NEBIVOLOL HCL 10 MG PO TABS: 10.0000 mg | ORAL_TABLET | Freq: Every day | ORAL | 2 refills | Status: DC

## 2021-11-29 NOTE — Progress Notes (Signed)
Primary Physician/Referring:  Leeroy Cha, MD  [name], female    DOB: [birth year], 87 y.o.   MRN: [id number]  Chief Complaint  Patient presents with   Follow-up   Hypertension   Congestive Heart Failure   HPI:    Rebecca Foster  is a 87 y.o. [place] female with mild hypertension, chronic diastolic heart failure, recurrent pleural effusion in 2020 which is now resolved. Nuclear stress negative for ischemia.  An echocardiogram had revealed hyperdynamic LVEF.  This is 8-monthoffice visit, accompanied by her daughter.  Patient is asymptomatic.  Past Medical History:  Diagnosis Date   Hypertension    Past Surgical History:  Procedure Laterality Date   IR THORACENTESIS ASP PLEURAL SPACE W/IMG GUIDE  12/20/2018   lung tap     NO PAST SURGERIES     [place] EFF[place] BILATERAL ([place]  10/19/2018    ROS  Review of Systems  Cardiovascular:  Negative for chest pain, dyspnea on exertion and leg swelling.  Gastrointestinal:  Negative for melena.   Objective      11/29/2021    2:32 PM 05/11/2021    3:12 PM 11/10/2020    3:29 PM  Vitals with BMI  Height _0  _1    Weight 102 lbs 95 lbs   BMI 125.85127.78  Systolic [id number] Diastolic 82 78 78  Pulse 82 77 76    Blood pressure (!) 168/82, pulse 82, height 5' (1.524 m), weight 102 lb (46.3 kg), SpO2 98 %. Body mass index is 19.92 kg/m.   Physical Exam Neck:     Vascular: No JVD.  Cardiovascular:     Rate and Rhythm: Normal rate and regular rhythm.     Pulses: Intact distal pulses.     Heart sounds: Normal heart sounds. No murmur heard.    No gallop.  Pulmonary:     Effort: Pulmonary effort is normal.     Breath sounds: Normal breath sounds.  Abdominal:     General: Bowel sounds are normal.     Palpations: Abdomen is soft.  Musculoskeletal:     Right lower leg: No edema.     Left lower leg: No edema.    Laboratory examination:   External labs: Labs 03/25/2021:  TSH  normal at 1.67.  Hb 11.7/HCT 35.1, platelets 234, normal indicis.  BUN 16, creatinine 1.02, EGFR 54 mL, potassium 4.2, LFTs normal.  Total cholesterol 182, triglycerides 93, HDL 72, LDL 94.  Medications and allergies   Allergies  Allergen Reactions   Hydrochlorothiazide Other (See Comments)    Renal insufficiency    Losartan Other (See Comments)    Acute renal failure   Amoxicillin Other (See Comments)    Extreme fatigue Did it involve swelling of the face/tongue/throat, SOB, or low BP? No Did it involve sudden or severe rash/hives, skin peeling, or any reaction on the inside of your mouth or nose? No Did you need to seek medical attention at a hospital or doctor's office? No When did it last happen?  89ish years old     If all above answers are "NO", may proceed with cephalosporin use.   Codeine Other (See Comments)    Higher dose - sensitive, burning sensation     Current Outpatient Medications  Medication Instructions   diltiazem (CARDIZEM CD) 180 mg, Oral, Every evening   levothyroxine (SYNTHROID) 25 mcg, Oral, Daily before breakfast   megestrol (MEGACE) 20 mg, Oral, 2 times  daily   nebivolol (BYSTOLIC) 10 mg, Oral, Daily after supper    Radiology:   CXR PA/LAT 11/01/2018:  Probable underlying COPD with bibasilar pleural effusions and atelectasis, increased on RIGHT and slightly decreased on LEFT since prior exam.  CXR PA/LAT 12/20/2018:  The heart size and mediastinal contours are within normal limits. Both lungs are clear. No pneumothorax or pleural effusion is noted. The visualized skeletal structures are unremarkable. IMPRESSION: No active disease.  Cardiac Studies:   Echocardiogram 10/20/2018:  1. Left ventricular ejection fraction, by visual estimation, is 65 to 70%. The left ventricle has hyperdynamic function. Decreased left ventricular size. There is mildly increased left ventricular hypertrophy.  2. Left ventricular diastolic Doppler parameters are  consistent with impaired relaxation pattern of LV diastolic filling. 3. 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 Trace mitral valve regurgitation. 4. The tricuspid valve is grossly normal. Tricuspid valve regurgitation is mild.  The tricuspid regurgitant velocity is 3.34 m/s, and with an assumed right atrial pressure of 3 mmHg, the estimated right ventricular systolic pressure is moderately elevated at 47.6 mmHg. 5. Small pericardial effusion with signs of organization. The pericardial effusion is predominantly anterior to the right ventricle and lateral to the left ventricle, also trivial amount posteriorly. Mitral outflow pattern does not suggest hemodynamic significance.  Left pleural effusion is noted.  Lexiscan Tetrofosmin Stress Test  12/23/2018: Nondiagnostic ECG stress. Normal myocardial perfusion. Stress LV EF: 59%.   Low risk study. No previous exam available for comparison.  EKG:   EKG 11/29/2021: Normal sinus rhythm with rate of 84 bpm, normal axis.  Incomplete right bundle branch block.  No evidence of ischemia, single PAC.  Compared to 05/11/2021, no significant change.    Assessment     ICD-10-CM   1. Primary hypertension  I10 EKG 12-Lead    nebivolol (BYSTOLIC) 10 MG tablet    DISCONTINUED: nebivolol (BYSTOLIC) 10 MG tablet    2. Chronic diastolic heart failure (HCC)  I50.32        Meds ordered this encounter  Medications   DISCONTD: nebivolol (BYSTOLIC) 10 MG tablet    Sig: Take 1 tablet (10 mg total) by mouth daily after supper.    Dispense:  30 tablet    Refill:  2   nebivolol (BYSTOLIC) 10 MG tablet    Sig: Take 1 tablet (10 mg total) by mouth daily after supper.    Dispense:  30 tablet    Refill:  2  Rx initially sent to [facility] then changed to [facility]  Recommendations:   Rebecca Foster  is a 87 y.o. [place] female with mild hypertension, chronic diastolic heart failure, recurrent pleural effusion in 2020 which is now  resolved. Nuclear stress negative for ischemia.  An echocardiogram had revealed hyperdynamic LVEF.  This is 54-monthoffice visit, accompanied by her daughter.    1. Primary hypertension Blood pressure is elevated today, she cannot tolerate ACE inhibitors or ARB or diuretics as it caused her to have renal failure and also hyponatremia.  I will try Bystolic 10 mg in the evening, she will continue with diltiazem CD in the morning.  She will monitor her blood pressure, goal blood pressure 140/80 mmHg or less.  If she continues to have elevated blood pressure patient will [place].  Otherwise I will see her back on a as needed basis.  2. [place] diastolic heart [place] She has fortunately not had any acute decompensated heart failure.  No  dyspnea.  No leg edema.    Adrian Prows, MD, [facility] 11/29/2021, 3:43 PM Office: [phone] Fax: [phone] Pager: [phone]

## 2021-12-03 ENCOUNTER — Telehealth: Payer: Self-pay | Admitting: Cardiology

## 2021-12-03 DIAGNOSIS — I10 Essential (primary) hypertension: Secondary | ICD-10-CM

## 2021-12-03 MED ORDER — HYDRALAZINE HCL 25 MG PO TABS: 25.0000 mg | ORAL_TABLET | Freq: Two times a day (BID) | ORAL | 2 refills | Status: DC

## 2021-12-03 NOTE — Telephone Encounter (Signed)
Chief Complaint  Patient presents with   Medication Reaction    Took Bystolic 2nd dose last night, felt palpitations, generalized anxiety.   Medications Discontinued During This Encounter  Medication Reason   nebivolol (BYSTOLIC) 10 MG tablet Side effect (s)    Meds ordered this encounter  Medications   hydrALAZINE (APRESOLINE) 25 MG tablet    Sig: Take 1 tablet (25 mg total) by mouth in the morning and at bedtime.    Dispense:  90 tablet    Refill:  2

## 2021-12-27 DIAGNOSIS — R55 Syncope and collapse: Secondary | ICD-10-CM | POA: Diagnosis not present

## 2021-12-27 DIAGNOSIS — I10 Essential (primary) hypertension: Secondary | ICD-10-CM | POA: Diagnosis not present

## 2021-12-27 DIAGNOSIS — I491 Atrial premature depolarization: Secondary | ICD-10-CM | POA: Diagnosis not present

## 2021-12-27 DIAGNOSIS — I959 Hypotension, unspecified: Secondary | ICD-10-CM | POA: Diagnosis not present

## 2021-12-27 DIAGNOSIS — R531 Weakness: Secondary | ICD-10-CM | POA: Diagnosis not present

## 2021-12-27 DIAGNOSIS — I110 Hypertensive heart disease with heart failure: Secondary | ICD-10-CM | POA: Diagnosis not present

## 2021-12-27 DIAGNOSIS — I5032 Chronic diastolic (congestive) heart failure: Secondary | ICD-10-CM | POA: Diagnosis not present

## 2021-12-27 DIAGNOSIS — I4510 Unspecified right bundle-branch block: Secondary | ICD-10-CM | POA: Diagnosis not present

## 2021-12-27 DIAGNOSIS — R42 Dizziness and giddiness: Secondary | ICD-10-CM | POA: Diagnosis not present

## 2021-12-28 DIAGNOSIS — R55 Syncope and collapse: Secondary | ICD-10-CM | POA: Diagnosis not present

## 2022-01-03 ENCOUNTER — Ambulatory Visit: Payer: Medicare Other | Admitting: Cardiology

## 2022-01-03 ENCOUNTER — Encounter: Payer: Self-pay | Admitting: Cardiology

## 2022-01-03 VITALS — BP 154/70 | HR 71 | Resp 16 | Ht 60.0 in | Wt 103.0 lb

## 2022-01-03 DIAGNOSIS — I10 Essential (primary) hypertension: Secondary | ICD-10-CM | POA: Diagnosis not present

## 2022-01-03 DIAGNOSIS — R55 Syncope and collapse: Secondary | ICD-10-CM | POA: Diagnosis not present

## 2022-01-03 NOTE — Progress Notes (Signed)
Primary Physician/Referring:  Leeroy Cha, MD  [name], female    DOB: Nov 11, 1934, 88 y.o.   MRN: [id number]  Chief Complaint  Patient presents with   Congestive Heart Failure   Hospitalization Follow-up   HPI:    Rebecca Foster  is a 88 y.o. [place] female with mild hypertension, chronic diastolic heart failure, recurrent pleural effusion in 2020 which is now resolved. Nuclear stress negative for ischemia.  An echocardiogram had revealed hyperdynamic LVEF.  Patient was seen in the emergency room on 04/27/2021 with syncope, states that she had started taking hydralazine 2 days before.  This is a follow-up ED visit.  Patient's daughter states that she has had several episodes where while she is laying in bed she feels anxious, mildly diaphoretic, feels like she wants to chew on ice followed by episodes of syncope either while trying to go to the bathroom or in bed itself.  Every episode that she had not mentioned in the past have premonitory symptoms.  Presently doing well, has not had any recurrence since hospital discharge.  Past Medical History:  Diagnosis Date   Hypertension    Past Surgical History:  Procedure Laterality Date   IR THORACENTESIS ASP PLEURAL SPACE W/IMG GUIDE  12/20/2018   lung tap     NO PAST SURGERIES     [place] PLEURAL EFFUSION BILATERAL ([place] HX)  10/19/2018    ROS  Review of Systems  Cardiovascular:  Negative for chest pain, dyspnea on exertion and leg swelling.  Gastrointestinal:  Negative for melena.   Objective      01/03/2022   10:25 AM 11/29/2021    2:32 PM 05/11/2021    3:12 PM  Vitals with BMI  Height _0  _1  _2   Weight 103 lbs 102 lbs 95 lbs  BMI 20.12 11.03 15.94  Systolic 585 929 244  Diastolic 70 82 78  Pulse 71 82 77    Blood pressure (!) 154/70, pulse 71, resp. rate 16, height 5' (1.524 m), weight 103 lb (46.7 kg), SpO2 99 %. Body mass index is 20.12 kg/m.   Physical Exam Neck:     Vascular: No  JVD.  Cardiovascular:     Rate and Rhythm: Normal rate and regular rhythm.     Pulses: Intact distal pulses.     Heart sounds: Normal heart sounds. No murmur heard.    No gallop.  Pulmonary:     Effort: Pulmonary effort is normal.     Breath sounds: Normal breath sounds.  Abdominal:     General: Bowel sounds are normal.     Palpations: Abdomen is soft.  Musculoskeletal:     Right lower leg: No edema.     Left lower leg: No edema.    Laboratory examination:   External labs:  Labs 03/25/2021:  TSH normal at 1.67.  Hb 11.7/HCT 35.1, platelets 234, normal indicis.  BUN 16, creatinine 1.02, EGFR 54 mL, potassium 4.2, LFTs normal.  Total cholesterol 182, triglycerides 93, HDL 72, LDL 94.  Medications and allergies   Allergies  Allergen Reactions   Hydrochlorothiazide Other (See Comments)    Renal insufficiency    Losartan Other (See Comments)    Acute renal failure   Bystolic [Nebivolol Hcl] Other (See Comments)    Anxiety, palpitations   Amoxicillin Other (See Comments)    Extreme fatigue Did it involve swelling of the face/tongue/throat, SOB, or low BP? No Did it involve sudden or severe rash/hives, skin peeling, or  any reaction on the inside of your mouth or nose? No Did you need to seek medical attention at a hospital or doctor's office? No When did it last happen?  64 ish years old     If all above answers are "NO", may proceed with cephalosporin use.   Codeine Other (See Comments)    Higher dose - sensitive, burning sensation     Current Outpatient Medications  Medication Instructions   diltiazem (CARDIZEM CD) 180 mg, Oral, Every evening   levothyroxine (SYNTHROID) 25 mcg, Oral, Daily before breakfast    Radiology:   CXR PA/LAT 11/01/2018:  Probable underlying COPD with bibasilar pleural effusions and atelectasis, increased on RIGHT and slightly decreased on LEFT since prior exam.  CXR PA/LAT 12/20/2018:  The heart size and mediastinal contours are  within normal limits. Both lungs are clear. No pneumothorax or pleural effusion is noted. The visualized skeletal structures are unremarkable. IMPRESSION: No active disease.  Cardiac Studies:   Echocardiogram 10/20/2018:  1. Left ventricular ejection fraction, by visual estimation, is 65 to 70%. The left ventricle has hyperdynamic function. Decreased left ventricular size. There is mildly increased left ventricular hypertrophy.  2. Left ventricular diastolic Doppler parameters are consistent with impaired relaxation pattern of LV diastolic filling. 3. 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 Trace mitral valve regurgitation. 4. The tricuspid valve is grossly normal. Tricuspid valve regurgitation is mild.  The tricuspid regurgitant velocity is 3.34 m/s, and with an assumed right atrial pressure of 3 mmHg, the estimated right ventricular systolic pressure is moderately elevated at 47.6 mmHg. 5. Small pericardial effusion with signs of organization. The pericardial effusion is predominantly anterior to the right ventricle and lateral to the left ventricle, also trivial amount posteriorly. Mitral outflow pattern does not suggest hemodynamic significance.  Left pleural effusion is noted.  Lexiscan Tetrofosmin Stress Test  12/23/2018: Nondiagnostic ECG stress. Normal myocardial perfusion. Stress LV EF: 59%.   Low risk study. No previous exam available for comparison.  EKG:   EKG 01/03/2022: Normal sinus rhythm with rate of 69 bpm, normal axis.  No evidence of ischemia, normal EKG.  Low-voltage complexes.  Compared to 11/29/2021, no significant change.  Assessment     ICD-10-CM   1. Vasovagal syncope  R55 EKG 12-Lead    2. Primary hypertension  I10        No orders of the defined types were placed in this encounter. Rx initially sent [facility] then changed to [facility]  Recommendations:   Rebecca Foster  is a 88 y.o. [place] female with mild hypertension,  chronic diastolic heart failure, recurrent pleural effusion in 2020 which is now resolved. Nuclear stress negative for ischemia.  An echocardiogram had revealed hyperdynamic LVEF.  Patient was seen in the emergency room on 04/27/2021 with syncope, states that she had started taking hydralazine 2 days before.  This is a follow-up ED visit, accompanied by her daughter.    1. Vasovagal syncope Patient symptoms of nausea, feeling anxious and dizzy prior to the episode of syncope that occurred on the commode is very classic for vasovagal syncope.  On further questioning, patient has had several episodes like this.  I have advised them regarding counterpressure maneuver and also advised him that if she has any symptoms that are proceeding in a similar fashion, to help raise her legs which will certainly abate her symptoms.  Good hydration was also discussed.  No further evaluation is indicated.  2. Primary hypertension Blood pressure is  elevated today, she thought hydralazine was causing her syncope but after discussions today, she is willing to restart hydralazine 1 tablet twice a day.  Otherwise she is stable from cardiac standpoint, she would like to see me back on a 6 monthly basis.     Adrian Prows, MD, [facility] 01/03/2022, 11:04 AM Office: [phone] Fax: [phone] Pager: [phone]

## 2022-01-07 ENCOUNTER — Other Ambulatory Visit: Payer: Self-pay | Admitting: Cardiology

## 2022-01-07 DIAGNOSIS — I10 Essential (primary) hypertension: Secondary | ICD-10-CM

## 2022-01-09 ENCOUNTER — Telehealth: Payer: Self-pay

## 2022-01-09 ENCOUNTER — Other Ambulatory Visit: Payer: Self-pay

## 2022-01-09 MED ORDER — HYDRALAZINE HCL 25 MG PO TABS: 25.0000 mg | ORAL_TABLET | Freq: Three times a day (TID) | ORAL | 2 refills | Status: DC

## 2022-01-09 NOTE — Telephone Encounter (Signed)
Yes, she can restart, She has Rx 25 mg three times a day

## 2022-01-09 NOTE — Telephone Encounter (Signed)
Called patient to inform her about the message above medical has been sent

## 2022-01-09 NOTE — Telephone Encounter (Unsigned)
Daughter wants to know if Hydralazine can be restarted  for mother? She is in [place] with her daughter.

## 2022-04-26 DIAGNOSIS — I10 Essential (primary) hypertension: Secondary | ICD-10-CM | POA: Diagnosis not present

## 2022-04-26 DIAGNOSIS — E039 Hypothyroidism, unspecified: Secondary | ICD-10-CM | POA: Diagnosis not present

## 2022-04-26 DIAGNOSIS — I5032 Chronic diastolic (congestive) heart failure: Secondary | ICD-10-CM | POA: Diagnosis not present

## 2022-04-26 DIAGNOSIS — R55 Syncope and collapse: Secondary | ICD-10-CM | POA: Diagnosis not present

## 2022-04-26 DIAGNOSIS — N1831 Chronic kidney disease, stage 3a: Secondary | ICD-10-CM | POA: Diagnosis not present

## 2022-05-16 DIAGNOSIS — R55 Syncope and collapse: Secondary | ICD-10-CM | POA: Diagnosis not present

## 2022-06-06 ENCOUNTER — Ambulatory Visit: Payer: Medicare Other | Admitting: Cardiology

## 2022-06-06 NOTE — Progress Notes (Deleted)
Primary Physician/Referring:  Lorenda Ishihara, MD  [name], female    DOB: 04-06-1934, 88 y.o.   MRN: [id number]  No chief complaint on file.  HPI:    Rebecca Foster  is a 88 y.o. Asian [place] female with mild hypertension, chronic diastolic heart failure, recurrent pleural effusion in 2020 which is now resolved. Nuclear stress negative for ischemia.  An echocardiogram had revealed hyperdynamic LVEF.  Patient was seen in the emergency room on 04/27/2021 with syncope, states that she had started taking hydralazine 2 days before.  This is a follow-up ED visit.  Patient's daughter states that she has had several episodes where while she is laying in bed she feels anxious, mildly diaphoretic, feels like she wants to chew on ice followed by episodes of syncope either while trying to go to the bathroom or in bed itself.  Every episode that she had not mentioned in the past have premonitory symptoms.  Presently doing well, has not had any recurrence since hospital discharge.  Past Medical History:  Diagnosis Date   Hypertension    Past Surgical History:  Procedure Laterality Date   IR THORACENTESIS ASP PLEURAL SPACE W/IMG GUIDE  12/20/2018   lung tap     NO PAST SURGERIES     [place] PLEURAL EFFUSION BILATERAL ([facility] HX)  10/19/2018    ROS  Review of Systems  Cardiovascular:  Negative for chest pain, dyspnea on exertion and leg swelling.  Gastrointestinal:  Negative for melena.   Objective      01/03/2022   10:25 AM 11/29/2021    2:32 PM 05/11/2021    3:12 PM  Vitals with BMI  Height 5\' 0"  5\' 0"  5\' 0"   Weight 103 lbs 102 lbs 95 lbs  BMI 20.12 19.92 18.55  Systolic 154 168 782  Diastolic 70 82 78  Pulse 71 82 77    There were no vitals taken for this visit. There is no height or weight on file to calculate BMI.   Physical Exam Neck:     Vascular: No JVD.  Cardiovascular:     Rate and Rhythm: Normal rate and regular rhythm.     Pulses: Intact distal pulses.      Heart sounds: Normal heart sounds. No murmur heard.    No gallop.  Pulmonary:     Effort: Pulmonary effort is normal.     Breath sounds: Normal breath sounds.  Abdominal:     General: Bowel sounds are normal.     Palpations: Abdomen is soft.  Musculoskeletal:     Right lower leg: No edema.     Left lower leg: No edema.    Laboratory examination:   External labs:  Labs 03/25/2021:  TSH normal at 1.67.  Hb 11.7/HCT 35.1, platelets 234, normal indicis.  BUN 16, creatinine 1.02, EGFR 54 mL, potassium 4.2, LFTs normal.  Total cholesterol 182, triglycerides 93, HDL 72, LDL 94.  Medications and allergies   Allergies  Allergen Reactions   Hydrochlorothiazide Other (See Comments)    Renal insufficiency    Losartan Other (See Comments)    Acute renal failure   Bystolic [Nebivolol Hcl] Other (See Comments)    Anxiety, palpitations   Amoxicillin Other (See Comments)    Extreme fatigue Did it involve swelling of the face/tongue/throat, SOB, or low BP? No Did it involve sudden or severe rash/hives, skin peeling, or any reaction on the inside of your mouth or nose? No Did you need to seek medical attention at  a hospital or doctor's office? No When did it last happen?  21 is[age] years old     If all above answers are "NO", may proceed with cephalosporin use.   Codeine Other (See Comments)    Higher dose - sensitive, burning sensation     Current Outpatient Medications  Medication Instructions   diltiazem (CARDIZEM CD) 180 mg, Oral, Every evening   hydrALAZINE (APRESOLINE) 25 mg, Oral, 3 times daily   levothyroxine (SYNTHROID) 25 mcg, Oral, Daily before breakfast    Radiology:   CXR PA/LAT 11/01/2018:  Probable underlying COPD with bibasilar pleural effusions and atelectasis, increased on RIGHT and slightly decreased on LEFT since prior exam.  CXR PA/LAT 12/20/2018:  The heart size and mediastinal contours are within normal limits. Both lungs are clear. No pneumothorax  or pleural effusion is noted. The visualized skeletal structures are unremarkable. IMPRESSION: No active disease.  Cardiac Studies:   Echocardiogram 10/20/2018:  1. Left ventricular ejection fraction, by visual estimation, is 65 to 70%. The left ventricle has hyperdynamic function. Decreased left ventricular size. There is mildly increased left ventricular hypertrophy.  2. Left ventricular diastolic Doppler parameters are consistent with impaired relaxation pattern of LV diastolic filling. 3. 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 Trace mitral valve regurgitation. 4. The tricuspid valve is grossly normal. Tricuspid valve regurgitation is mild.  The tricuspid regurgitant velocity is 3.34 m/s, and with an assumed right atrial pressure of 3 mmHg, the estimated right ventricular systolic pressure is moderately elevated at 47.6 mmHg. 5. Small pericardial effusion with signs of organization. The pericardial effusion is predominantly anterior to the right ventricle and lateral to the left ventricle, also trivial amount posteriorly. Mitral outflow pattern does not suggest hemodynamic significance.  Left pleural effusion is noted.  Lexiscan Tetrofosmin Stress Test  12/23/2018: Nondiagnostic ECG stress. Normal myocardial perfusion. Stress LV EF: 59%.   Low risk study. No previous exam available for comparison.  EKG:   EKG 01/03/2022: Normal sinus rhythm with rate of 69 bpm, normal axis.  No evidence of ischemia, normal EKG.  Low-voltage complexes.  Compared to 11/29/2021, no significant change.  Assessment     ICD-10-CM   1. Vasovagal syncope  R55     2. Primary hypertension  I10     3. Chronic diastolic heart failure (HCC)  N82.95        No orders of the defined types were placed in this encounter. Rx initially sent to [facility] then changed to [facility]  Recommendations:   Rebecca Foster  is a 88 y.o. Asian [place] female with mild hypertension, chronic diastolic  heart failure, recurrent pleural effusion in 2020 which is now resolved. Nuclear stress negative for ischemia.  An echocardiogram had revealed hyperdynamic LVEF.  Patient was seen in the emergency room on 04/27/2021 with syncope, states that she had started taking hydralazine 2 days before.  This is a follow-up ED visit, accompanied by her daughter.    1. Vasovagal syncope Patient symptoms of nausea, feeling anxious and dizzy prior to the episode of syncope that occurred on the commode is very classic for vasovagal syncope.  On further questioning, patient has had several episodes like this.  I have advised them regarding counterpressure maneuver and also advised him that if she has any symptoms that are proceeding in a similar fashion, to help raise her legs which will certainly abate her symptoms.  Good hydration was also discussed.  No further evaluation is indicated.  2. Primary hypertension Blood  pressure is elevated today, she thought hydralazine was causing her syncope but after discussions today, she is willing to restart hydralazine 1 tablet twice a day.  Otherwise she is stable from cardiac standpoint, she would like to see me back on a 6 monthly basis.     Yates Decamp, MD, [facility] 06/06/2022, 9:57 AM Office: [phone] Fax: [phone] Pager: [phone]

## 2022-07-05 ENCOUNTER — Other Ambulatory Visit: Payer: Self-pay | Admitting: Cardiology

## 2022-11-11 DIAGNOSIS — Z23 Encounter for immunization: Secondary | ICD-10-CM | POA: Diagnosis not present

## 2022-11-15 ENCOUNTER — Other Ambulatory Visit: Payer: Self-pay | Admitting: Cardiology

## 2022-11-15 DIAGNOSIS — I10 Essential (primary) hypertension: Secondary | ICD-10-CM

## 2022-11-16 ENCOUNTER — Other Ambulatory Visit: Payer: Self-pay | Admitting: Cardiology

## 2022-11-16 DIAGNOSIS — I10 Essential (primary) hypertension: Secondary | ICD-10-CM

## 2022-11-17 DIAGNOSIS — Z0000 Encounter for general adult medical examination without abnormal findings: Secondary | ICD-10-CM | POA: Diagnosis not present

## 2022-11-17 DIAGNOSIS — D649 Anemia, unspecified: Secondary | ICD-10-CM | POA: Diagnosis not present

## 2022-11-17 DIAGNOSIS — I10 Essential (primary) hypertension: Secondary | ICD-10-CM | POA: Diagnosis not present

## 2022-11-17 DIAGNOSIS — M810 Age-related osteoporosis without current pathological fracture: Secondary | ICD-10-CM | POA: Diagnosis not present

## 2022-11-17 DIAGNOSIS — E441 Mild protein-calorie malnutrition: Secondary | ICD-10-CM | POA: Diagnosis not present

## 2022-11-17 DIAGNOSIS — I5032 Chronic diastolic (congestive) heart failure: Secondary | ICD-10-CM | POA: Diagnosis not present

## 2022-11-17 DIAGNOSIS — E039 Hypothyroidism, unspecified: Secondary | ICD-10-CM | POA: Diagnosis not present

## 2022-11-17 DIAGNOSIS — H9193 Unspecified hearing loss, bilateral: Secondary | ICD-10-CM | POA: Diagnosis not present

## 2022-11-17 DIAGNOSIS — N1831 Chronic kidney disease, stage 3a: Secondary | ICD-10-CM | POA: Diagnosis not present

## 2022-12-12 DIAGNOSIS — H353111 Nonexudative age-related macular degeneration, right eye, early dry stage: Secondary | ICD-10-CM | POA: Diagnosis not present

## 2023-01-08 DIAGNOSIS — Z23 Encounter for immunization: Secondary | ICD-10-CM | POA: Diagnosis not present

## 2023-04-27 DIAGNOSIS — I10 Essential (primary) hypertension: Secondary | ICD-10-CM | POA: Diagnosis not present

## 2023-04-27 DIAGNOSIS — J069 Acute upper respiratory infection, unspecified: Secondary | ICD-10-CM | POA: Diagnosis not present

## 2023-06-04 DIAGNOSIS — I5032 Chronic diastolic (congestive) heart failure: Secondary | ICD-10-CM | POA: Diagnosis not present

## 2023-06-04 DIAGNOSIS — J301 Allergic rhinitis due to pollen: Secondary | ICD-10-CM | POA: Diagnosis not present

## 2023-06-04 DIAGNOSIS — N1831 Chronic kidney disease, stage 3a: Secondary | ICD-10-CM | POA: Diagnosis not present

## 2023-06-04 DIAGNOSIS — I10 Essential (primary) hypertension: Secondary | ICD-10-CM | POA: Diagnosis not present

## 2023-06-04 DIAGNOSIS — E039 Hypothyroidism, unspecified: Secondary | ICD-10-CM | POA: Diagnosis not present

## 2023-06-04 DIAGNOSIS — R54 Age-related physical debility: Secondary | ICD-10-CM | POA: Diagnosis not present

## 2023-06-04 DIAGNOSIS — M810 Age-related osteoporosis without current pathological fracture: Secondary | ICD-10-CM | POA: Diagnosis not present

## 2023-06-04 DIAGNOSIS — E441 Mild protein-calorie malnutrition: Secondary | ICD-10-CM | POA: Diagnosis not present

## 2023-07-16 ENCOUNTER — Other Ambulatory Visit: Payer: Self-pay | Admitting: Cardiology

## 2024-01-01 ENCOUNTER — Telehealth: Payer: Self-pay | Admitting: Cardiology

## 2024-01-01 ENCOUNTER — Encounter: Payer: Self-pay | Admitting: Cardiology

## 2024-01-01 NOTE — Telephone Encounter (Signed)
 Spoke with patient's daughter Rosalynn, she reports she has been with the patient more often lately and has noticed in the last 2-3 weeks she has been having SOB with activity.   Pooja states it is most noticeable after patient showers, or when she is carrying something like water. She will have to sit and rest to catch her breath, but is able to recover in a few minutes.  Pooja denies any swelling or any other symptoms. Reports patient just had her physical and is doing well. She does not have any issues walking around home at normal pace.  Patient is scheduled to see Dr. Ladona on 01/28/2024. Will forward to Dr. Ladona to review.

## 2024-01-01 NOTE — Telephone Encounter (Signed)
 Pt c/o Shortness Of Breath: STAT if SOB developed within the last 24 hours or pt is noticeably SOB on the phone  1. Are you currently SOB (can you hear that pt is SOB on the phone)? No   2. How long have you been experiencing SOB? 2 weeks   3. Are you SOB when sitting or when up moving around? Moving around   4. Are you currently experiencing any other symptoms? No

## 2024-01-01 NOTE — Telephone Encounter (Signed)
 Daughter also called, spoke with patient's daughter. See phone note for details.

## 2024-01-28 ENCOUNTER — Ambulatory Visit: Admitting: Cardiology

## 2024-02-15 ENCOUNTER — Ambulatory Visit: Admitting: Emergency Medicine
